# Patient Record
Sex: Male | Born: 1965 | Race: White | Hispanic: No | Marital: Married | Smoking: Former smoker
Health system: Southern US, Community
[De-identification: ages and names within clinical notes are randomized; demographics above are authoritative.]

## PROBLEM LIST (undated history)

## (undated) DIAGNOSIS — K219 Gastro-esophageal reflux disease without esophagitis: Secondary | ICD-10-CM

## (undated) DIAGNOSIS — I10 Essential (primary) hypertension: Secondary | ICD-10-CM

## (undated) DIAGNOSIS — F419 Anxiety disorder, unspecified: Secondary | ICD-10-CM

## (undated) HISTORY — DX: Essential (primary) hypertension: I10

## (undated) HISTORY — PX: TUMOR EXCISION: SHX421

## (undated) HISTORY — DX: Anxiety disorder, unspecified: F41.9

## (undated) HISTORY — PX: OTHER SURGICAL HISTORY: SHX169

## (undated) HISTORY — DX: Gastro-esophageal reflux disease without esophagitis: K21.9

---

## 1999-08-11 ENCOUNTER — Encounter: Payer: Self-pay | Admitting: Internal Medicine

## 1999-08-11 ENCOUNTER — Ambulatory Visit (HOSPITAL_COMMUNITY): Admission: RE | Admit: 1999-08-11 | Discharge: 1999-08-11 | Payer: Self-pay | Admitting: Internal Medicine

## 2000-09-21 ENCOUNTER — Ambulatory Visit (HOSPITAL_COMMUNITY): Admission: RE | Admit: 2000-09-21 | Discharge: 2000-09-21 | Payer: Self-pay | Admitting: Internal Medicine

## 2000-09-21 ENCOUNTER — Encounter: Payer: Self-pay | Admitting: Internal Medicine

## 2001-09-10 ENCOUNTER — Ambulatory Visit (HOSPITAL_COMMUNITY): Admission: RE | Admit: 2001-09-10 | Discharge: 2001-09-10 | Payer: Self-pay | Admitting: Internal Medicine

## 2001-09-10 ENCOUNTER — Encounter: Payer: Self-pay | Admitting: Internal Medicine

## 2001-11-09 ENCOUNTER — Encounter: Payer: Self-pay | Admitting: Orthopedic Surgery

## 2001-11-09 ENCOUNTER — Encounter: Admission: RE | Admit: 2001-11-09 | Discharge: 2001-11-09 | Payer: Self-pay | Admitting: Orthopedic Surgery

## 2001-11-30 ENCOUNTER — Encounter: Admission: RE | Admit: 2001-11-30 | Discharge: 2001-11-30 | Payer: Self-pay | Admitting: Orthopedic Surgery

## 2001-11-30 ENCOUNTER — Encounter: Payer: Self-pay | Admitting: Orthopedic Surgery

## 2003-05-01 ENCOUNTER — Encounter: Payer: Self-pay | Admitting: Internal Medicine

## 2003-05-01 ENCOUNTER — Encounter: Admission: RE | Admit: 2003-05-01 | Discharge: 2003-05-01 | Payer: Self-pay | Admitting: Internal Medicine

## 2005-07-26 ENCOUNTER — Ambulatory Visit (HOSPITAL_COMMUNITY): Admission: RE | Admit: 2005-07-26 | Discharge: 2005-07-26 | Payer: Self-pay | Admitting: Orthopedic Surgery

## 2005-07-26 ENCOUNTER — Ambulatory Visit (HOSPITAL_BASED_OUTPATIENT_CLINIC_OR_DEPARTMENT_OTHER): Admission: RE | Admit: 2005-07-26 | Discharge: 2005-07-26 | Payer: Self-pay | Admitting: Orthopedic Surgery

## 2016-06-03 ENCOUNTER — Other Ambulatory Visit: Payer: Self-pay | Admitting: Orthopedic Surgery

## 2016-06-03 DIAGNOSIS — R531 Weakness: Secondary | ICD-10-CM

## 2016-06-03 DIAGNOSIS — M5412 Radiculopathy, cervical region: Secondary | ICD-10-CM

## 2016-06-03 DIAGNOSIS — M79603 Pain in arm, unspecified: Secondary | ICD-10-CM

## 2016-06-17 ENCOUNTER — Ambulatory Visit
Admission: RE | Admit: 2016-06-17 | Discharge: 2016-06-17 | Disposition: A | Discharge status: Home / Self Care | Payer: Self-pay | Source: Ambulatory Visit | Attending: Orthopedic Surgery | Admitting: Orthopedic Surgery

## 2016-06-17 DIAGNOSIS — M5412 Radiculopathy, cervical region: Secondary | ICD-10-CM

## 2016-06-17 DIAGNOSIS — R531 Weakness: Secondary | ICD-10-CM

## 2016-06-17 DIAGNOSIS — M79603 Pain in arm, unspecified: Secondary | ICD-10-CM

## 2017-11-08 NOTE — Patient Instructions (Signed)

## 2017-11-08 NOTE — Progress Notes (Signed)
[facility]ULT [facility]   Unk Pinto, M.D.     Uvaldo Bristle. Silverio Lay, P.A.-C Liane Comber, [place]                [place] [address] Telephone [phone] Telefax [phone] Annual  Screening/Preventative Visit  & Comprehensive Evaluation & Examination     This very nice 52 y.o.  MWM last seen about 6-7 years ago returns to re-establish &  presents for a Screening/Preventative Visit & comprehensive evaluation and management of multiple medical co-morbidities.  Patient has hx/o labile HTN, HLD and is screened for Prediabetes and Vitamin D Deficiency.      Patient's BP has allegedly been controlled at home. Patient has Morbid Obesity (BMI 33+) and alleges a 30# weight loss over the last 3 months. Today's BP is sl elevated at 132/94. Patient denies any cardiac symptoms as chest pain, palpitations, shortness of breath, dizziness or ankle swelling.        Finally, patient has history of Vitamin D Deficiency.  Allergies  Allergen Reactions  . Penicillins    Health Maintenance  Topic Date Due  . HIV Screening  12/23/1979  . TETANUS/TDAP  12/23/1983  . COLONOSCOPY  12/23/2014  . INFLUENZA VACCINE  03/08/2018   Immunization History  Administered Date(s) Administered  . Tdap 11/09/2017   Last Colon - declines   Social History   Socioeconomic History  . Marital status: Married    Spouse name: Beverlee Nims  . Number of children: 1 son 62 yo & 1 daughter 27 yo.   Occupational History  . Manages a [employer].   Tobacco Use  . Smoking status: Former Smoker    Last attempt to quit: 10/17/2016    Years since quitting: 1.0  . Smokeless tobacco: Never Used  Substance and Sexual Activity  . Alcohol use: Never    Frequency: Never  . Drug use: Not on file  . Sexual activity: Active    ROS Constitutional: Denies fever, chills, weight loss/gain, headaches, insomnia,   night sweats or change in appetite. Does c/o fatigue. Eyes: Denies redness, blurred vision, diplopia, discharge, itchy or watery eyes.  ENT: Denies discharge, congestion, post nasal drip, epistaxis, sore throat, earache, hearing loss, dental pain, Tinnitus, Vertigo, Sinus pain or snoring.  Cardio: Denies chest pain, palpitations, irregular heartbeat, syncope, dyspnea, diaphoresis, orthopnea, PND, claudication or edema Respiratory: denies cough, dyspnea, DOE, pleurisy, hoarseness, laryngitis or wheezing.  Gastrointestinal: Denies dysphagia, heartburn, reflux, water brash, pain, cramps, nausea, vomiting, bloating, diarrhea, constipation, hematemesis, melena, hematochezia, jaundice or hemorrhoids Genitourinary: Denies dysuria, frequency, urgency, nocturia, hesitancy, discharge, hematuria or flank pain Musculoskeletal: Denies arthralgia, myalgia, stiffness, Jt. Swelling, pain, limp or strain/sprain. Denies Falls. Skin: Denies puritis, rash, hives, warts, acne, eczema or change in skin lesion Neuro: No weakness, tremor, incoordination, spasms, paresthesia or pain Psychiatric: Denies confusion, memory loss or sensory loss. Denies Depression. Endocrine: Denies change in weight, skin, hair change, nocturia, and paresthesia, diabetic polys, visual blurring or hyper / hypo glycemic episodes.  Heme/Lymph: No excessive bleeding, bruising or enlarged lymph nodes.  Physical Exam  BP (!) 132/94   Pulse 76   Temp 97.9 F (36.6 C)   Resp 16   Ht 5\' 8"  (1.727 m)   Wt 221 lb 9.6 oz (100.5 kg)   BMI 33.69 kg/m   General Appearance: Well nourished and well  groomed and in no apparent distress.  Eyes: PERRLA, EOMs, conjunctiva no swelling or erythema, normal fundi and vessels. Sinuses: No frontal/maxillary tenderness ENT/Mouth: EACs patent / TMs  nl. Nares clear without erythema, swelling, mucoid exudates. Oral hygiene is good. No erythema, swelling, or exudate. Tongue normal, non-obstructing. Tonsils not  swollen or erythematous. Hearing normal.  Neck: Supple, thyroid not palpable. No bruits, nodes or JVD. Respiratory: Respiratory effort normal.  BS equal and clear bilateral without rales, rhonci, wheezing or stridor. Cardio: Heart sounds are normal with regular rate and rhythm and no murmurs, rubs or gallops. Peripheral pulses are normal and equal bilaterally without edema. No aortic or femoral bruits. Chest: symmetric with normal excursions and percussion.  Abdomen: Soft, with Nl bowel sounds. Nontender, no guarding, rebound, hernias, masses, or organomegaly.  Lymphatics: Non tender without lymphadenopathy.  Genitourinary: No hernias.Testes nl. DRE - prostate nl for age - smooth & firm w/o nodules. Musculoskeletal: Full ROM all peripheral extremities, joint stability, 5/5 strength, and normal gait. Skin: Warm and dry without rashes, lesions, cyanosis, clubbing or  ecchymosis.  Neuro: Cranial nerves intact, reflexes equal bilaterally. Normal muscle tone, no cerebellar symptoms. Sensation intact.  Pysch: Alert and oriented X 3 with normal affect, insight and judgment appropriate.   Assessment and Plan  1. Annual Preventative/Screening Exam   2. Labile hypertension  - encouraged home BP monitoring - EKG 12-Lead - [place], RETROPERITNL ABD,  LTD - CBC with Differential/Platelet  3. Hyperlipidemia, mixed  - EKG 12-Lead - [place], RETROPERITNL ABD,  LTD - Lipid panel  4. Abnormal glucose  - EKG 12-Lead - [place], RETROPERITNL ABD,  LTD  5. Vitamin D deficiency   6. Encounter for colorectal cancer screening  - POC Hemoccult Bld/Stl (3-Cd Home Screen); Future  7. Prostate cancer screening  - PSA  8. Screening for ischemic heart disease  - EKG 12-Lead  9. FHx: heart disease  - EKG 12-Lead - [place], RETROPERITNL ABD,  LTD  10. Screening for AAA (aortic abdominal aneurysm)  - [place], RETROPERITNL ABD,  LTD  11. Need for prophylactic vaccination with combined diphtheria-tetanus-pertussis  (DTP) vaccine  - Tdap vaccine greater than or equal to 7yo IM  12. Fatigue  - CBC with Differential/Platelet  13. Medication management  - CBC with Differential/Platelet - Lipid panel          Patient was counseled in prudent diet, weight control to achieve/maintain BMI less than 25, BP monitoring, regular exercise and medications as discussed. Very limited lab screening at patient request due to high deductible Over 40 minutes of exam, counseling, chart review and high complex critical decision making was performed

## 2017-11-09 ENCOUNTER — Encounter: Payer: Self-pay | Admitting: Internal Medicine

## 2017-11-09 ENCOUNTER — Ambulatory Visit: Payer: BC Managed Care – PPO | Admitting: Internal Medicine

## 2017-11-09 VITALS — BP 132/94 | HR 76 | Temp 97.9°F | Resp 16 | Ht 68.0 in | Wt 221.6 lb

## 2017-11-09 DIAGNOSIS — I10 Essential (primary) hypertension: Secondary | ICD-10-CM

## 2017-11-09 DIAGNOSIS — R5383 Other fatigue: Secondary | ICD-10-CM

## 2017-11-09 DIAGNOSIS — Z1212 Encounter for screening for malignant neoplasm of rectum: Secondary | ICD-10-CM

## 2017-11-09 DIAGNOSIS — Z79899 Other long term (current) drug therapy: Secondary | ICD-10-CM

## 2017-11-09 DIAGNOSIS — R7309 Other abnormal glucose: Secondary | ICD-10-CM

## 2017-11-09 DIAGNOSIS — Z0001 Encounter for general adult medical examination with abnormal findings: Secondary | ICD-10-CM

## 2017-11-09 DIAGNOSIS — Z0000 Encounter for general adult medical examination without abnormal findings: Secondary | ICD-10-CM | POA: Diagnosis not present

## 2017-11-09 DIAGNOSIS — Z13220 Encounter for screening for lipoid disorders: Secondary | ICD-10-CM | POA: Diagnosis not present

## 2017-11-09 DIAGNOSIS — Z136 Encounter for screening for cardiovascular disorders: Secondary | ICD-10-CM | POA: Diagnosis not present

## 2017-11-09 DIAGNOSIS — Z23 Encounter for immunization: Secondary | ICD-10-CM

## 2017-11-09 DIAGNOSIS — E559 Vitamin D deficiency, unspecified: Secondary | ICD-10-CM

## 2017-11-09 DIAGNOSIS — R0989 Other specified symptoms and signs involving the circulatory and respiratory systems: Secondary | ICD-10-CM

## 2017-11-09 DIAGNOSIS — Z8249 Family history of ischemic heart disease and other diseases of the circulatory system: Secondary | ICD-10-CM

## 2017-11-09 DIAGNOSIS — Z125 Encounter for screening for malignant neoplasm of prostate: Secondary | ICD-10-CM | POA: Diagnosis not present

## 2017-11-09 DIAGNOSIS — Z1211 Encounter for screening for malignant neoplasm of colon: Secondary | ICD-10-CM

## 2017-11-09 DIAGNOSIS — E782 Mixed hyperlipidemia: Secondary | ICD-10-CM

## 2017-11-09 LAB — LIPID PANEL
CHOLESTEROL: 146 mg/dL (ref <200)
HDL: 35 mg/dL — ABNORMAL LOW (ref >40)
LDL Cholesterol (Calc): 92 mg/dL (calc)
Non-HDL Cholesterol (Calc): 111 mg/dL (calc) (ref <130)
Total CHOL/HDL Ratio: 4.2 (calc) (ref <5.0)
Triglycerides: 95 mg/dL (ref <150)

## 2017-11-09 LAB — CBC WITH DIFFERENTIAL/PLATELET
BASOS PCT: 1.1 %
Basophils Absolute: 53 cells/uL (ref 0–200)
Eosinophils Absolute: 72 cells/uL (ref 15–500)
Eosinophils Relative: 1.5 %
HCT: 43.4 % (ref 38.5–50.0)
Hemoglobin: 14.8 g/dL (ref 13.2–17.1)
Lymphs Abs: 1382 cells/uL (ref 850–3900)
MCH: 30.2 pg (ref 27.0–33.0)
MCHC: 34.1 g/dL (ref 32.0–36.0)
MCV: 88.6 fL (ref 80.0–100.0)
MONOS PCT: 8.4 %
MPV: 10.1 fL (ref 7.5–12.5)
NEUTROS PCT: 60.2 %
Neutro Abs: 2890 cells/uL (ref 1500–7800)
PLATELETS: 202 10*3/uL (ref 140–400)
RBC: 4.9 10*6/uL (ref 4.20–5.80)
RDW: 12.6 % (ref 11.0–15.0)
TOTAL LYMPHOCYTE: 28.8 %
WBC: 4.8 10*3/uL (ref 3.8–10.8)
WBCMIX: 403 {cells}/uL (ref 200–950)

## 2017-11-09 LAB — PSA: PSA: 0.4 ng/mL (ref <=4.0)

## 2017-11-09 MED ORDER — ATENOLOL 50 MG PO TABS: ORAL_TABLET | ORAL | 1 refills | Status: DC

## 2017-12-19 ENCOUNTER — Ambulatory Visit: Payer: BC Managed Care – PPO | Admitting: Internal Medicine

## 2017-12-19 ENCOUNTER — Encounter: Payer: Self-pay | Admitting: Internal Medicine

## 2017-12-19 VITALS — BP 120/76 | HR 60 | Temp 97.1°F | Resp 16 | Ht 68.0 in | Wt 218.4 lb

## 2017-12-19 DIAGNOSIS — Z8249 Family history of ischemic heart disease and other diseases of the circulatory system: Secondary | ICD-10-CM | POA: Diagnosis not present

## 2017-12-19 DIAGNOSIS — E559 Vitamin D deficiency, unspecified: Secondary | ICD-10-CM

## 2017-12-19 DIAGNOSIS — K219 Gastro-esophageal reflux disease without esophagitis: Secondary | ICD-10-CM | POA: Diagnosis not present

## 2017-12-19 DIAGNOSIS — I10 Essential (primary) hypertension: Secondary | ICD-10-CM

## 2017-12-19 DIAGNOSIS — Z87891 Personal history of nicotine dependence: Secondary | ICD-10-CM | POA: Diagnosis not present

## 2017-12-19 NOTE — Patient Instructions (Signed)

## 2017-12-19 NOTE — Progress Notes (Signed)
  Subjective:    Patient ID: Derek Diaz, male    DOB: 1965/12/03, 52 y.o.   MRN: [id number]  HPI  This nice 52 yo MWM returns for 1 month f/u after starting Atenolol 50 mg for HTN and had an initial hypotensive rxn and was advised to decrease dose to 1/2 tab daily. Since then he reports BP's have been normal and he has felt much better in general.  He is down another 3-4# since last OV - now a total loss of 34# since Jan 1st ! which he attributes to better food choices. Hypertensive systems review is negative.    Medication Sig  . atenolol (TENORMIN) 50 MG tablet Take 1 tablet every morning for BP   Review of Systems  10 point systems review negative except as above.    Objective:   Physical Exam  BP 120/76   Pulse 60   Temp (!) 97.1 F (36.2 C)   Resp 16   Ht 5\' 8"  (1.727 m)   Wt 218 lb 6.4 oz (99.1 kg)   BMI 33.21 kg/m   HEENT - WNL. Neck - supple.  Chest - Clear equal BS. Cor - Nl HS. RRR w/o sig MGR. PP 1(+). No edema. MS- FROM w/o deformities.  Gait Nl. Neuro -  Nl w/o focal abnormalities.    Assessment & Plan:   1. Labile hypertension  - continue med sane and ROV 5 months . Also advised to take Vit D 10,000 units daily and also a LD bASA  81 mg for Cancer prophylaxis.

## 2018-01-12 IMAGING — MR MR CERVICAL SPINE W/O CM
4 of 5 series · 27 of 48 positions shown · non-contrast
Comparison: None.

CLINICAL DATA: Initial evaluation for two-month history of neck
pain radiating into left shoulder with left arm numbness.

EXAM:
MRI CERVICAL SPINE WITHOUT CONTRAST
TECHNIQUE: Multiplanar, multisequence MR imaging of the cervical spine was
performed. No intravenous contrast was administered.

[Series 3: tir sag · sagittal · 3.0mm · 0.41mm/px · 6 of 13 slices shown]
[im 1/13]
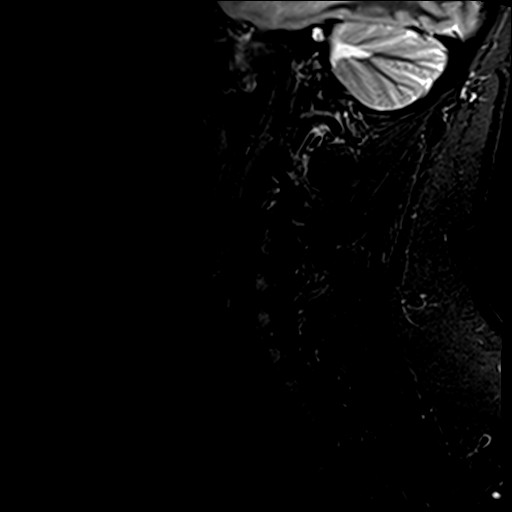
[im 3/13]
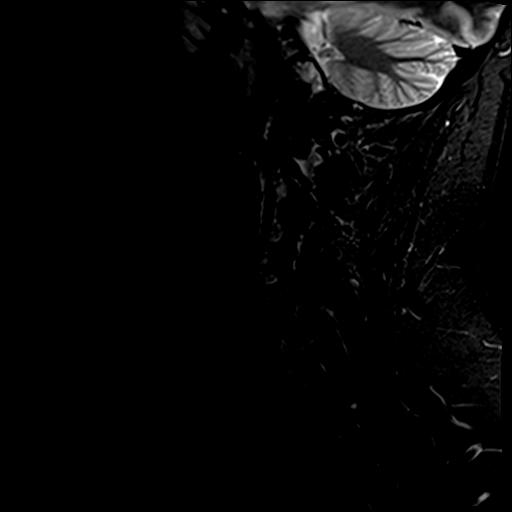
[im 5/13]
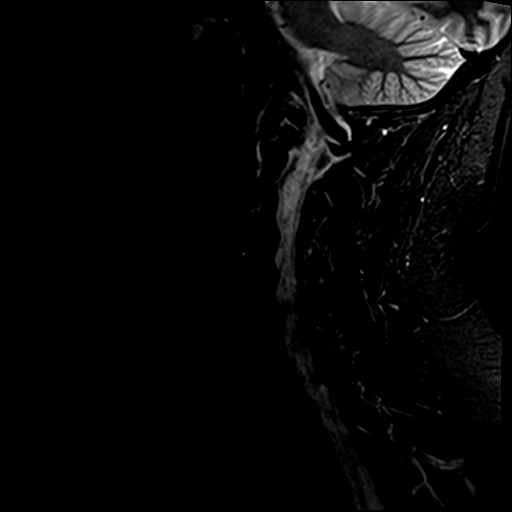
[im 8/13]
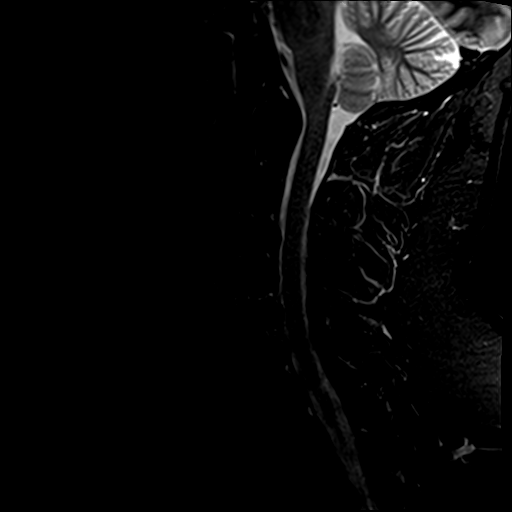
[im 10/13]
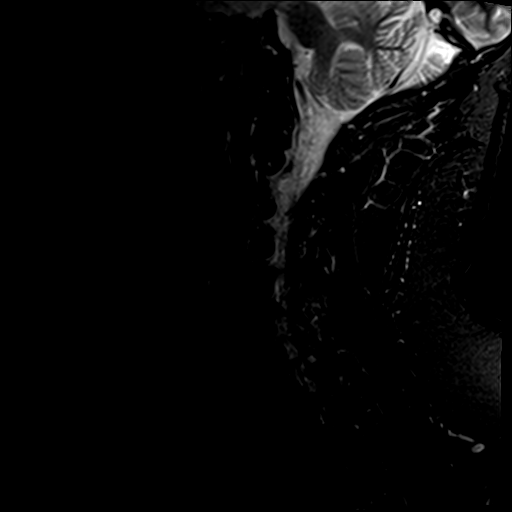
[im 13/13]
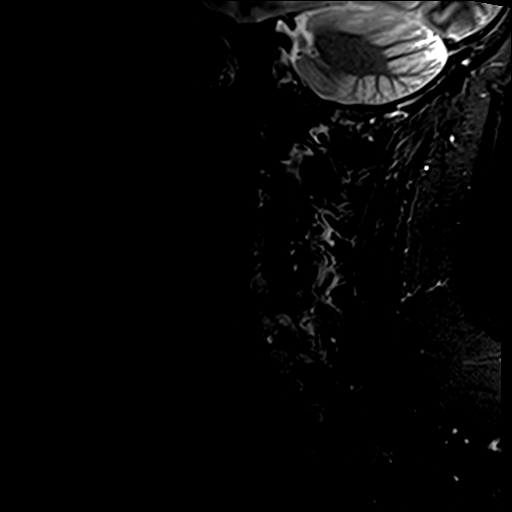

[Series 4: T1 · sagittal · 3.0mm · 0.41mm/px · 6 of 13 slices shown]
[im 1/13]
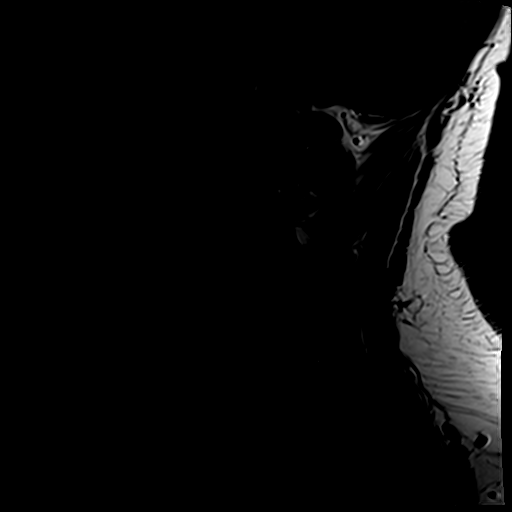
[im 3/13]
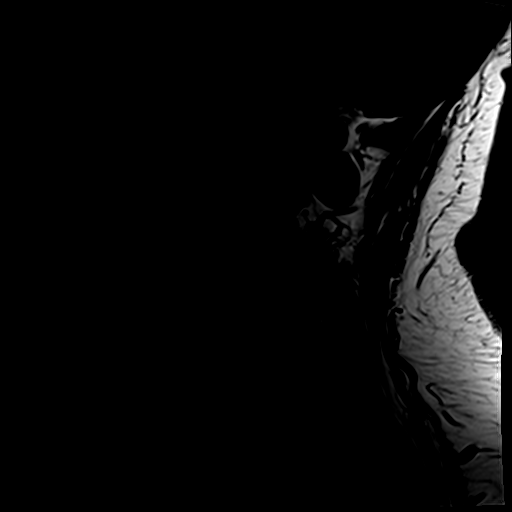
[im 5/13]
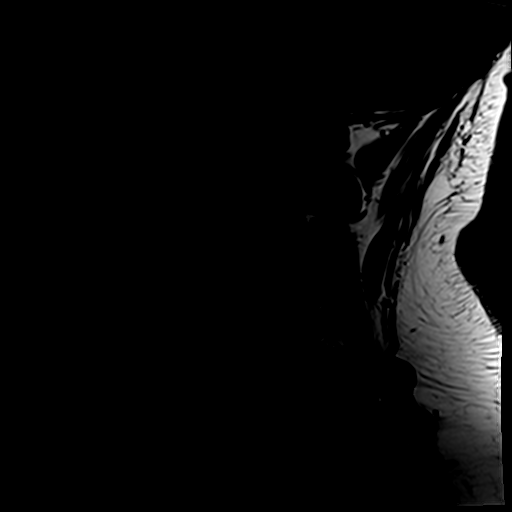
[im 8/13]
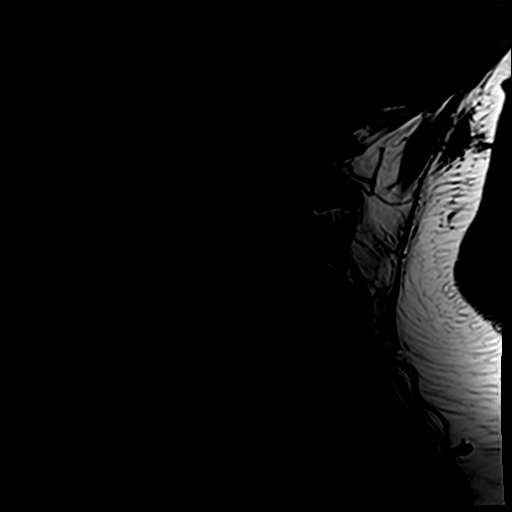
[im 10/13]
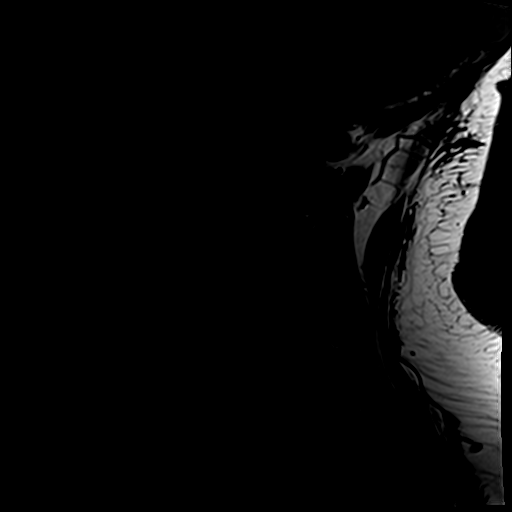
[im 13/13]
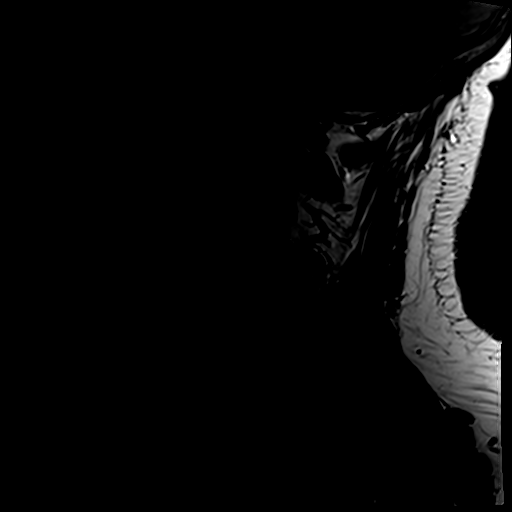

[Series 5: T2 · sagittal · 3.0mm · 0.66mm/px · 6 of 13 slices shown (1 of 2)]
[im 1/13]
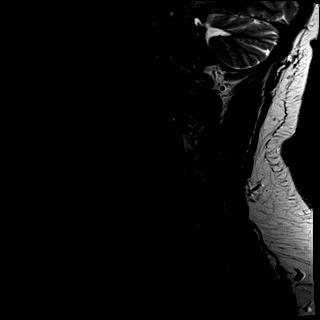
[im 3/13]
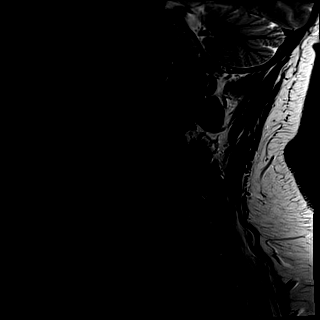
[im 5/13]
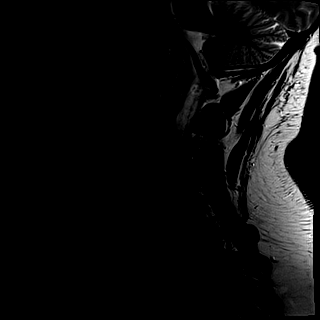
[im 8/13]
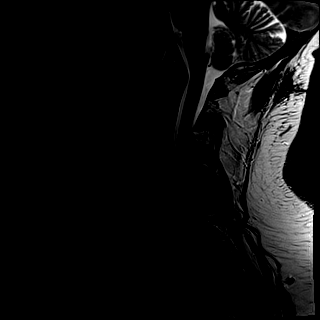
[im 10/13]
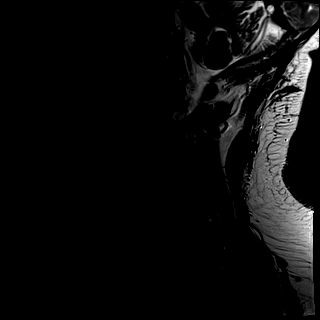
[im 13/13]
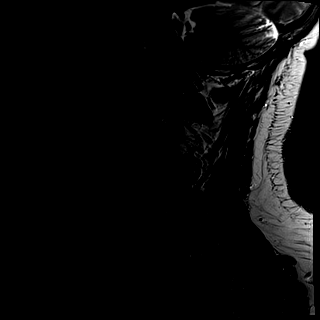

[Series 6: T2 · axial · 3.0mm · 0.70mm/px · z∈[-49,+68]mm · 9 of 32 slices shown (2 of 2)]
[im 1/32]
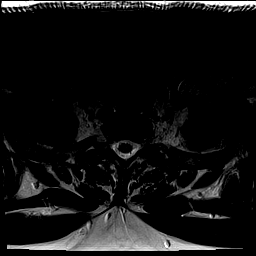
[im 5/32]
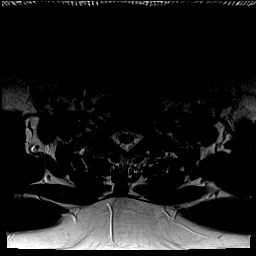
[im 9/32]
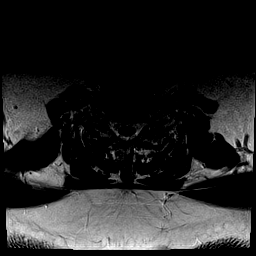
[im 14/32]
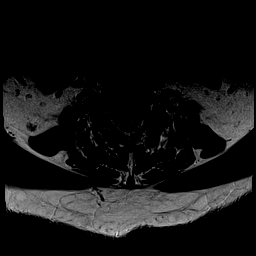
[im 16/32]
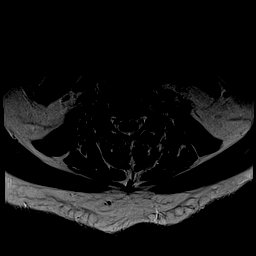
[im 18/32]
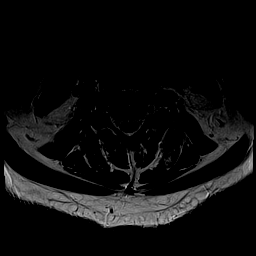
[im 23/32]
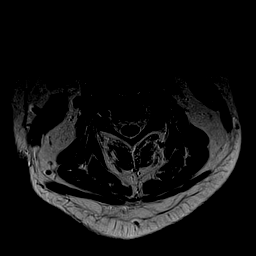
[im 27/32]
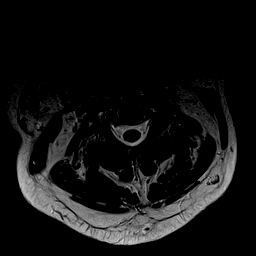
[im 32/32]
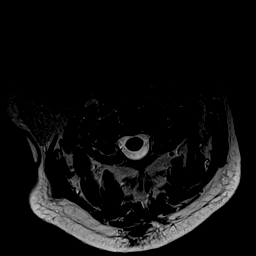

[27 of 48 positions shown; findings below may reference images not displayed]

FINDINGS: Alignment: Vertebral bodies normally aligned with preservation of
the normal cervical lordosis. No listhesis.

Vertebrae: Vertebral body heights are well maintained. No focal
osseous lesions. There is abnormal soft tissue and marrow edema
centered about the left C6-7 facet (series 3, image 13), likely
related to facet degeneration. No discrete fracture or and or
malalignment.

Signal intensity within the vertebral body bone marrow otherwise
normal.

Cord: Signal intensity within the cervical spinal cord is normal.

Posterior Fossa, vertebral arteries, paraspinal tissues: Visualized
portions of the brain and posterior fossa are normal. Craniocervical
junction within normal limits. Other than the inflammatory changes
about the left C6-7 facet, no other soft tissue abnormality
identified. No prevertebral edema. Normal intravascular flow voids
present within the vertebral arteries bilaterally.

Disc levels:

C2-C3: Negative.

C3-C4: Mild diffuse disc bulge with bilateral uncovertebral
hypertrophy. No significant stenosis.

C4-C5: Mild diffuse disc bulge with bilateral uncovertebral
hypertrophy. Mild bilateral foraminal stenosis. No significant canal
narrowing.

C5-C6: Mild diffuse disc bulge with bilateral uncovertebral
spurring. Mild bilateral foraminal stenosis. No significant canal
narrowing posterior disc bulge mildly flattens the ventral thecal
sac without significant canal stenosis.

C6-C7: Mild diffuse disc bulge with bilateral uncovertebral
hypertrophy. No significant canal or foraminal stenosis. Abnormal
edema about the left C6-7 facet (series 3, image 13). This is felt
to most likely be related to facet arthritis. No loculated
collection or other concerning features. Inflammatory changes extend
towards the left C6-7 neural foramen, likely affecting the left C7
nerve root (series 7, image 24). Inflammatory changes also extend
superiorly towards the left C5-6 neural foramina.

C7-T1:  Negative.

Visualized upper thoracic spine within normal limits.
IMPRESSION: 1. Abnormal edema and inflammatory changes about the left C6-7
facet, likely related to underlying facet arthropathy, which may be
either degenerative or inflammatory in nature. Please note that an
infectious process could conceivably have this appearance as well,
and could be considered in the correct clinical setting. Close
interval follow-up with possible repeat imaging (with and without
contrast) is recommended if there is high clinical suspicion for
possible infection.
2. Additional mild multilevel degenerative spondylolysis as above
without significant stenosis.

## 2018-05-09 NOTE — Progress Notes (Signed)
This very nice 52 y.o. MWM presents for 3 month follow up with HTN, HLD, Pre-Diabetes and Vitamin D Deficiency.      Patient is treated for HTN & BP has been controlled at home. Today's BP is at goal - 130/78.  Patient has had no complaints of any cardiac type chest pain, palpitations, dyspnea / orthopnea / PND, dizziness, claudication, or dependent edema.     Hyperlipidemia is controlled with diet & meds. Patient denies myalgias or other med SE's. Last Lipids were  Lab Results  Component Value Date   CHOL 146 11/09/2017   HDL 35 (L) 11/09/2017   LDLCALC 92 11/09/2017   TRIG 95 11/09/2017   CHOLHDL 4.2 11/09/2017      Also, the patient has history of PreDiabetes and has had no symptoms of reactive hypoglycemia, diabetic polys, paresthesias or visual blurring.   Patient's weight is down    43# from 250# in Jan to current 207#      .      Further, the patient also has known history of Vitamin D Deficiency.  Current Outpatient Medications on File Prior to Visit  Medication Sig  . atenolol (TENORMIN) 50 MG tablet Take 1 tablet every morning for BP   No current facility-administered medications on file prior to visit.    Allergies  Allergen Reactions  . Penicillins    PMHx:  No past medical history on file.   Immunization History  Administered Date(s) Administered  . Tdap 11/09/2017   No past surgical history on file.   FHx:    Reviewed / unchanged  SHx:    Reviewed / unchanged   Systems Review:  Constitutional: Denies fever, chills, wt changes, headaches, insomnia, fatigue, night sweats, change in appetite. Eyes: Denies redness, blurred vision, diplopia, discharge, itchy, watery eyes.  ENT: Denies discharge, congestion, post nasal drip, epistaxis, sore throat, earache, hearing loss, dental pain, tinnitus, vertigo, sinus pain, snoring.  CV: Denies chest pain, palpitations, irregular heartbeat, syncope, dyspnea, diaphoresis, orthopnea, PND, claudication or  edema. Respiratory: denies cough, dyspnea, DOE, pleurisy, hoarseness, laryngitis, wheezing.  Gastrointestinal: Denies dysphagia, odynophagia, heartburn, reflux, water brash, abdominal pain or cramps, nausea, vomiting, bloating, diarrhea, constipation, hematemesis, melena, hematochezia  or hemorrhoids. Genitourinary: Denies dysuria, frequency, urgency, nocturia, hesitancy, discharge, hematuria or flank pain. Musculoskeletal: Denies arthralgias, myalgias, stiffness, jt. swelling, pain, limping or strain/sprain.  Skin: Denies pruritus, rash, hives, warts, acne, eczema or change in skin lesion(s). Neuro: No weakness, tremor, incoordination, spasms, paresthesia or pain. Psychiatric: Denies confusion, memory loss or sensory loss. Endo: Denies change in weight, skin or hair change.  Heme/Lymph: No excessive bleeding, bruising or enlarged lymph nodes.  Physical Exam  BP 130/78   Pulse (!) 52   Temp (!) 97 F (36.1 C)   Resp 16   Ht 5\' 8"  (1.727 m)   Wt 207 lb 3.2 oz (94 kg)   BMI 31.50 kg/m   Appears  well nourished, well groomed  and in no distress.  Eyes: PERRLA, EOMs, conjunctiva no swelling or erythema. Sinuses: No frontal/maxillary tenderness ENT/Mouth: EAC's clear, TM's nl w/o erythema, bulging. Nares clear w/o erythema, swelling, exudates. Oropharynx clear without erythema or exudates. Oral hygiene is good. Tongue normal, non obstructing. Hearing intact.  Neck: Supple. Thyroid not palpable. Car 2+/2+ without bruits, nodes or JVD. Chest: Respirations nl with BS clear & equal w/o rales, rhonchi, wheezing or stridor.  Cor: Heart sounds normal w/ regular rate and rhythm without sig. murmurs, gallops, clicks  or rubs. Peripheral pulses normal and equal  without edema.  Abdomen: Soft & bowel sounds normal. Non-tender w/o guarding, rebound, hernias, masses or organomegaly.  Lymphatics: Unremarkable.  Musculoskeletal: Full ROM all peripheral extremities, joint stability, 5/5 strength and  normal gait.  Skin: Warm, dry without exposed rashes, lesions or ecchymosis apparent.  Neuro: Cranial nerves intact, reflexes equal bilaterally. Sensory-motor testing grossly intact. Tendon reflexes grossly intact.  Pysch: Alert & oriented x 3.  Insight and judgement nl & appropriate. No ideations.  Assessment and Plan:  - Continue medication, monitor blood pressure at home.  - Continue DASH diet.  Reminder to go to the ER if any CP,  SOB, nausea, dizziness, severe HA, changes vision/speech.  - Continue diet/meds, exercise,& lifestyle modifications.  - Continue monitor periodic cholesterol/liver & renal functions   1. Essential hypertension  - TSH  2. Hyperlipidemia, mixed   3. Abnormal glucose  - Continue diet, exercise, lifestyle modifications.  - Monitor appropriate labs.  - Hemoglobin A1c  4. Vitamin D deficiency  - Continue supplementation.   5. Medication management  - TSH - Hemoglobin A1c      Discussed  regular exercise, BP monitoring, weight control to achieve/maintain BMI less than 25 and discussed med and SE's. Recommended labs to assess and monitor clinical status with further disposition pending results of labs. Over 30 minutes of exam, counseling, chart review was performed.

## 2018-05-09 NOTE — Patient Instructions (Signed)

## 2018-05-10 ENCOUNTER — Ambulatory Visit: Payer: BC Managed Care – PPO | Admitting: Internal Medicine

## 2018-05-10 VITALS — BP 130/78 | HR 52 | Temp 97.0°F | Resp 16 | Ht 68.0 in | Wt 207.2 lb

## 2018-05-10 DIAGNOSIS — E782 Mixed hyperlipidemia: Secondary | ICD-10-CM | POA: Diagnosis not present

## 2018-05-10 DIAGNOSIS — E559 Vitamin D deficiency, unspecified: Secondary | ICD-10-CM | POA: Diagnosis not present

## 2018-05-10 DIAGNOSIS — I10 Essential (primary) hypertension: Secondary | ICD-10-CM | POA: Diagnosis not present

## 2018-05-10 DIAGNOSIS — R7309 Other abnormal glucose: Secondary | ICD-10-CM | POA: Diagnosis not present

## 2018-05-10 DIAGNOSIS — Z79899 Other long term (current) drug therapy: Secondary | ICD-10-CM

## 2018-05-11 LAB — TSH: TSH: 0.88 mIU/L (ref 0.40–4.50)

## 2018-05-11 LAB — HEMOGLOBIN A1C
HEMOGLOBIN A1C: 4.1 %{Hb} (ref <5.7)
MEAN PLASMA GLUCOSE: 71 (calc)
eAG (mmol/L): 3.9 (calc)

## 2018-05-13 ENCOUNTER — Encounter: Payer: Self-pay | Admitting: Internal Medicine

## 2018-07-10 ENCOUNTER — Other Ambulatory Visit: Payer: Self-pay | Admitting: Internal Medicine

## 2018-12-02 NOTE — Progress Notes (Signed)
[facility]   Unk Pinto, M.D.     Uvaldo Bristle. Silverio Lay, P.A.-C Liane Comber, [place]                [address] [place], [place] [address] Telephone [phone] Telefax [phone] Annual  Screening/Preventative Visit  & Comprehensive Evaluation & Examination  History of Present Illness:     This very nice 53 y.o. MWM presents for a Screening /Preventative Visit & comprehensive evaluation and management of multiple medical co-morbidities.  Patient has been followed for HTN, HLD, Prediabetes and Vitamin D Deficiency.     HTN predates since  April 2019.  Patient's BP has been controlled at home.  Today's BP is at goal - 132/84. Patient denies any cardiac symptoms as chest pain, palpitations, shortness of breath, dizziness or ankle swelling. Patient is walking/jogging  3-4 miles at least 5 days/week.     Patient's hyperlipidemia is controlled with diet. Last lipids were at goal: Lab Results  Component Value Date   CHOL 146 11/09/2017   HDL 35 (L) 11/09/2017   LDLCALC 92 11/09/2017   TRIG 95 11/09/2017   CHOLHDL 4.2 11/09/2017      Patient has hx/o abnormal glucose  and patient denies reactive hypoglycemic symptoms, visual blurring, diabetic polys or paresthesias.  Last year , he lost 19 #down from 250# in Jan to 207 # in April 2019. Last A1c was Normal: Lab Results  Component Value Date   HGBA1C 4.1 05/10/2018       Finally, patient has history of Vitamin D Deficiency of    and last vitamin D was  No results found for: VD25OH  Current Outpatient Medications on File Prior to Visit  Medication Sig  . atenolol (TENORMIN) 50 MG tablet TAKE 1 TABLET EVERY MORNING FOR BP   No current facility-administered medications on file prior to visit.    Allergies  Allergen Reactions  . Penicillins    History reviewed. No pertinent past medical history. Health Maintenance  Topic Date Due  .  HIV Screening  12/23/1979  . COLONOSCOPY  12/23/2014  . INFLUENZA VACCINE  03/09/2019  . TETANUS/TDAP  11/10/2027   Immunization History  Administered Date(s) Administered  . Tdap 11/09/2017   History reviewed. No pertinent surgical history.   Family History  Problem Relation Age of Onset  . Stroke Mother   . Heart disease Mother   . Stroke Father   . Hypertension Father   . Heart disease Father   . Hypertension Brother    Social History   Socioeconomic History  . Marital status: Married    Spouse name: Not on file  . Number of children: Not on file  Occupational History  . Not on file  Tobacco Use  . Smoking status: Former Smoker    Last attempt to quit: 10/17/2016    Years since quitting: 2.1  . Smokeless tobacco: Never Used  Substance and Sexual Activity  . Alcohol use: Never    Frequency: Never  . Drug use: Never  . Sexual activity: Yes    ROS Constitutional: Denies fever, chills, weight loss/gain, headaches, insomnia,  night sweats or change in appetite. Does c/o fatigue. Eyes: Denies redness, blurred vision, diplopia, discharge, itchy or watery eyes.  ENT: Denies discharge, congestion, post nasal drip, epistaxis, sore throat, earache, hearing loss, dental pain, Tinnitus, Vertigo, Sinus pain or snoring.  Cardio: Denies chest pain, palpitations, irregular heartbeat, syncope, dyspnea, diaphoresis, orthopnea, PND, claudication or edema Respiratory: denies cough, dyspnea, DOE, pleurisy, hoarseness, laryngitis or wheezing.  Gastrointestinal: Denies dysphagia, heartburn, reflux, water brash, pain, cramps, nausea, vomiting, bloating, diarrhea, constipation, hematemesis, melena, hematochezia, jaundice or hemorrhoids Genitourinary: Denies dysuria, frequency, urgency, nocturia, hesitancy, discharge, hematuria or flank pain Musculoskeletal: Denies arthralgia, myalgia, stiffness, Jt. Swelling, pain, limp or strain/sprain. Denies Falls. Skin: Denies puritis, rash, hives, warts,  acne, eczema or change in skin lesion Neuro: No weakness, tremor, incoordination, spasms, paresthesia or pain Psychiatric: Denies confusion, memory loss or sensory loss. Denies Depression. Endocrine: Denies change in weight, skin, hair change, nocturia, and paresthesia, diabetic polys, visual blurring or hyper / hypo glycemic episodes.  Heme/Lymph: No excessive bleeding, bruising or enlarged lymph nodes.  Physical Exam  BP 132/84   Pulse (!) 56   Temp (!) 97.3 F (36.3 C)   Resp 16   Ht 5\' 8"  (1.727 m)   Wt 204 lb 12.8 oz (92.9 kg)   BMI 31.14 kg/m   General Appearance: Well nourished and well groomed and in no apparent distress.  Eyes: PERRLA, EOMs, conjunctiva no swelling or erythema, normal fundi and vessels. Sinuses: No frontal/maxillary tenderness ENT/Mouth: EACs patent / TMs  nl. Nares clear without erythema, swelling, mucoid exudates. Oral hygiene is good. No erythema, swelling, or exudate. Tongue normal, non-obstructing. Tonsils not swollen or erythematous. Hearing normal.  Neck: Supple, thyroid not palpable. No bruits, nodes or JVD. Respiratory: Respiratory effort normal.  BS equal and clear bilateral without rales, rhonci, wheezing or stridor. Cardio: Heart sounds are normal with regular rate and rhythm and no murmurs, rubs or gallops. Peripheral pulses are normal and equal bilaterally without edema. No aortic or femoral bruits. Chest: symmetric with normal excursions and percussion.  Abdomen: Soft, with Nl bowel sounds. Nontender, no guarding, rebound, hernias, masses, or organomegaly.  Lymphatics: Non tender without lymphadenopathy.  Musculoskeletal: Full ROM all peripheral extremities, joint stability, 5/5 strength, and normal gait. Skin: Warm and dry without rashes, lesions, cyanosis, clubbing or  ecchymosis.  Neuro: Cranial nerves intact, reflexes equal bilaterally. Normal muscle tone, no cerebellar symptoms. Sensation intact.  Pysch: Alert and oriented X 3 with normal  affect, insight and judgment appropriate.   Assessment and Plan  1. Annual Preventative/Screening Exam   2. Essential hypertension  - EKG 12-Lead - [place], RETROPERITNL ABD,  LTD - Urinalysis, Routine w reflex microscopic - CBC with Differential/Platelet - COMPLETE METABOLIC PANEL WITH GFR - Magnesium - TSH  3. Hyperlipidemia, mixed  - EKG 12-Lead - [place], RETROPERITNL ABD,  LTD - Lipid panel - TSH  4. Abnormal glucose  - EKG 12-Lead - [place], RETROPERITNL ABD,  LTD - Hemoglobin A1c  5. Vitamin D deficiency   6. BPH with obstruction/lower urinary tract symptoms  - PSA  7. Screening for colorectal cancer  - POC Hemoccult Bld/Stl  - Hemoglobin A1c  8. Screening-pulmonary TB  - TB Skin Test  9. Prostate cancer screening  - PSA  10. Screening for ischemic heart disease  - EKG 12-Lead  11. FHx: heart disease  - EKG 12-Lead - [place], RETROPERITNL ABD,  LTD  12. Former smoker  - EKG 12-Lead - [place], RETROPERITNL ABD,  LTD  13. Screening for AAA (aortic abdominal aneurysm)  - [place], RETROPERITNL ABD,  LTD  14. Fatigue  - Iron,Total/Total Iron Binding Cap - Vitamin B12 - Testosterone - CBC with Differential/Platelet - TSH  15. Medication management  - Urinalysis, Routine w reflex  microscopic - POC Hemoccult Bld/Stl (3-Cd Home Screen); Future - CBC with Differential/Platelet - COMPLETE METABOLIC PANEL WITH GFR - Magnesium - Lipid panel - TSH - Hemoglobin A1c - Insulin, random - VITAMIN D 25 Hydroxyl        Patient was counseled in prudent diet, weight control to achieve/maintain BMI less than 25, BP monitoring, regular exercise and medications as discussed.  Discussed med effects and SE's. Routine screening labs and tests as requested with regular follow-up as recommended. I discussed the assessment and treatment plan as above with the patient. The patient was provided an opportunity to ask questions and all were answered. The patient agreed with the plan and  demonstrated an understanding of the instructions.Over 40 minutes of exam, counseling, chart review and high complex critical decision making was performed  Kirtland Bouchard, MD

## 2018-12-02 NOTE — Patient Instructions (Signed)
Coronavirus (COVID-19) Are you at risk?  Are you at risk for the Coronavirus (COVID-19)?  To be considered HIGH RISK for Coronavirus (COVID-19), you have to meet the following criteria:  . Traveled to [place], [place], [place], [place] or [place]; or in the [place] to [place], [place], [place]  . or [place]; and have fever, cough, and shortness of breath within the last 2 weeks of travel OR . Been in close contact with a person diagnosed with COVID-19 within the last 2 weeks and have  . fever, cough,and shortness of breath .  . IF YOU DO NOT MEET THESE CRITERIA, YOU ARE CONSIDERED LOW RISK FOR COVID-19.  What to do if you are HIGH RISK for COVID-19?  . If you are having a medical emergency, call 911. . Seek medical care right away. Before you go to a doctor's office, urgent care or emergency department, .  call ahead and tell them about your recent travel, contact with someone diagnosed with COVID-19  .  and your symptoms.  . You should receive instructions from your physician's office regarding next steps of care.  . When you arrive at healthcare provider, tell the healthcare staff immediately you have returned from  . visiting [place], [place], [place], [place] or [place]; or traveled in the [place] to [place], [place],  . [place] or [place] in the last two weeks or you have been in close contact with a person diagnosed with  . COVID-19 in the last 2 weeks.   . Tell the health care staff about your symptoms: fever, cough and shortness of breath. . After you have been seen by a medical provider, you will be either: o Tested for (COVID-19) and discharged home on quarantine except to seek medical care if  o symptoms worsen, and asked to  - Stay home and avoid contact with others until you get your results (4-5 days)  - Avoid travel on public transportation if possible (such as bus, train, or airplane) or o Sent to the Emergency Department by EMS for evaluation,  COVID-19 testing  and  o possible admission depending on your condition and test results.  What to do if you are LOW RISK for COVID-19?  Reduce your risk of any infection by using the same precautions used for avoiding the common cold or flu:  . Wash your hands often with soap and warm water for at least 20 seconds.  If soap and water are not readily available,  . use an alcohol-based hand sanitizer with at least 60% alcohol.  . If coughing or sneezing, cover your mouth and nose by coughing or sneezing into the elbow areas of your shirt or coat, .  into a tissue or into your sleeve (not your hands). . Avoid shaking hands with others and consider head nods or verbal greetings only. . Avoid touching your eyes, nose, or mouth with unwashed hands.  . Avoid close contact with people who are sick. . Avoid places or events with large numbers of people in one location, like concerts or sporting events. . Carefully consider travel plans you have or are making. . If you are planning any travel outside or inside the [place], visit the CDC's Travelers' Health webpage for the latest health notices. . If you have some symptoms but not all symptoms, continue to monitor at home and seek medical attention  . if your symptoms worsen. . If you are having a medical emergency, call 911. >>>>>>>>>>>>>>>>>>>>>>>>>>>>   Preventive Care for Adults  A healthy lifestyle and preventive care can promote health and wellness. Preventive health guidelines for men include the following key practices:  A routine yearly physical is a good way to check with your health care provider about your health and preventative screening. It is a chance to share any concerns and updates on your health and to receive a thorough exam.  Visit your dentist for a routine exam and preventative care every 6 months. Brush your teeth twice a day and floss once a day. Good oral hygiene prevents tooth decay and gum disease.  The frequency of eye exams  is based on your age, health, family medical history, use of contact lenses, and other factors. Follow your health care provider's recommendations for frequency of eye exams.  Eat a healthy diet. Foods such as vegetables, fruits, whole grains, low-fat dairy products, and lean protein foods contain the nutrients you need without too many calories. Decrease your intake of foods high in solid fats, added sugars, and salt. Eat the right amount of calories for you. Get information about a proper diet from your health care provider, if necessary.  Regular physical exercise is one of the most important things you can do for your health. Most adults should get at least 150 minutes of moderate-intensity exercise (any activity that increases your heart rate and causes you to sweat) each week. In addition, most adults need muscle-strengthening exercises on 2 or more days a week.  Maintain a healthy weight. The body mass index (BMI) is a screening tool to identify possible weight problems. It provides an estimate of body fat based on height and weight. Your health care provider can find your BMI and can help you achieve or maintain a healthy weight. For adults 20 years and older:  A BMI below 18.5 is considered underweight.  A BMI of 18.5 to 24.9 is normal.  A BMI of 25 to 29.9 is considered overweight.  A BMI of 30 and above is considered obese.  Maintain normal blood lipids and cholesterol levels by exercising and minimizing your intake of saturated fat. Eat a balanced diet with plenty of fruit and vegetables. Blood tests for lipids and cholesterol should begin at age 20 and be repeated every 5 years. If your lipid or cholesterol levels are high, you are over 50, or you are at high risk for heart disease, you may need your cholesterol levels checked more frequently. Ongoing high lipid and cholesterol levels should be treated with medicines if diet and exercise are not working.  If you smoke, find out from  your health care provider how to quit. If you do not use tobacco, do not start.  Lung cancer screening is recommended for adults aged 55-80 years who are at high risk for developing lung cancer because of a history of smoking. A yearly low-dose CT scan of the lungs is recommended for people who have at least a 30-pack-year history of smoking and are a current smoker or have quit within the past 15 years. A pack year of smoking is smoking an average of 1 pack of cigarettes a day for 1 year (for example: 1 pack a day for 30 years or 2 packs a day for 15 years). Yearly screening should continue until the smoker has stopped smoking for at least 15 years. Yearly screening should be stopped for people who develop a health problem that would prevent them from having lung cancer treatment.  If you choose to drink alcohol,   do not have more than 2 drinks per day. One drink is considered to be 12 ounces (355 mL) of beer, 5 ounces (148 mL) of wine, or 1.5 ounces (44 mL) of liquor.  Avoid use of street drugs. Do not share needles with anyone. Ask for help if you need support or instructions about stopping the use of drugs.  High blood pressure causes heart disease and increases the risk of stroke. Your blood pressure should be checked at least every 1-2 years. Ongoing high blood pressure should be treated with medicines, if weight loss and exercise are not effective.  If you are 45-79 years old, ask your health care provider if you should take aspirin to prevent heart disease.  Diabetes screening involves taking a blood sample to check your fasting blood sugar level. This should be done once every 3 years, after age 45, if you are within normal weight and without risk factors for diabetes. Testing should be considered at a younger age or be carried out more frequently if you are overweight and have at least 1 risk factor for diabetes.  Colorectal cancer can be detected and often prevented. Most routine colorectal  cancer screening begins at the age of 50 and continues through age 75. However, your health care provider may recommend screening at an earlier age if you have risk factors for colon cancer. On a yearly basis, your health care provider may provide home test kits to check for hidden blood in the stool. Use of a small camera at the end of a tube to directly examine the colon (sigmoidoscopy or colonoscopy) can detect the earliest forms of colorectal cancer. Talk to your health care provider about this at age 50, when routine screening begins. Direct exam of the colon should be repeated every 5-10 years through age 75, unless early forms of precancerous polyps or small growths are found.   Talk with your health care provider about prostate cancer screening.  Testicular cancer screening isrecommended for adult males. Screening includes self-exam, a health care provider exam, and other screening tests. Consult with your health care provider about any symptoms you have or any concerns you have about testicular cancer.  Use sunscreen. Apply sunscreen liberally and repeatedly throughout the day. You should seek shade when your shadow is shorter than you. Protect yourself by wearing long sleeves, pants, a wide-brimmed hat, and sunglasses year round, whenever you are outdoors.  Once a month, do a whole-body skin exam, using a mirror to look at the skin on your back. Tell your health care provider about new moles, moles that have irregular borders, moles that are larger than a pencil eraser, or moles that have changed in shape or color.  Stay current with required vaccines (immunizations).  Influenza vaccine. All adults should be immunized every year.  Tetanus, diphtheria, and acellular pertussis (Td, Tdap) vaccine. An adult who has not previously received Tdap or who does not know his vaccine status should receive 1 dose of Tdap. This initial dose should be followed by tetanus and diphtheria toxoids (Td) booster  doses every 10 years. Adults with an unknown or incomplete history of completing a 3-dose immunization series with Td-containing vaccines should begin or complete a primary immunization series including a Tdap dose. Adults should receive a Td booster every 10 years.  Varicella vaccine. An adult without evidence of immunity to varicella should receive 2 doses or a second dose if he has previously received 1 dose.  Human papillomavirus (HPV) vaccine. Males aged 13-21   years who have not received the vaccine previously should receive the 3-dose series. Males aged 22-26 years may be immunized. Immunization is recommended through the age of 26 years for any male who has sex with males and did not get any or all doses earlier. Immunization is recommended for any person with an immunocompromised condition through the age of 26 years if he did not get any or all doses earlier. During the 3-dose series, the second dose should be obtained 4-8 weeks after the first dose. The third dose should be obtained 24 weeks after the first dose and 16 weeks after the second dose.  Zoster vaccine. One dose is recommended for adults aged 60 years or older unless certain conditions are present.    PREVNAR  - Pneumococcal 13-valent conjugate (PCV13) vaccine. When indicated, a person who is uncertain of his immunization history and has no record of immunization should receive the PCV13 vaccine. An adult aged 19 years or older who has certain medical conditions and has not been previously immunized should receive 1 dose of PCV13 vaccine. This PCV13 should be followed with a dose of pneumococcal polysaccharide (PPSV23) vaccine. The PPSV23 vaccine dose should be obtained at least 1 r more year(s) after the dose of PCV13 vaccine. An adult aged 19 years or older who has certain medical conditions and previously received 1 or more doses of PPSV23 vaccine should receive 1 dose of PCV13. The PCV13 vaccine dose should be obtained 1 or more  years after the last PPSV23 vaccine dose.    PNEUMOVAX - Pneumococcal polysaccharide (PPSV23) vaccine. When PCV13 is also indicated, PCV13 should be obtained first. All adults aged 65 years and older should be immunized. An adult younger than age 65 years who has certain medical conditions should be immunized. Any person who resides in a nursing home or long-term care facility should be immunized. An adult smoker should be immunized. People with an immunocompromised condition and certain other conditions should receive both PCV13 and PPSV23 vaccines. People with human immunodeficiency virus (HIV) infection should be immunized as soon as possible after diagnosis. Immunization during chemotherapy or radiation therapy should be avoided. Routine use of PPSV23 vaccine is not recommended for American Indians, [place] Natives, or people younger than 65 years unless there are medical conditions that require PPSV23 vaccine. When indicated, people who have unknown immunization and have no record of immunization should receive PPSV23 vaccine. One-time revaccination 5 years after the first dose of PPSV23 is recommended for people aged 19-64 years who have chronic kidney failure, nephrotic syndrome, asplenia, or immunocompromised conditions. People who received 1-2 doses of PPSV23 before age 65 years should receive another dose of PPSV23 vaccine at age 65 years or later if at least 5 years have passed since the previous dose. Doses of PPSV23 are not needed for people immunized with PPSV23 at or after age 65 years.    Hepatitis A vaccine. Adults who wish to be protected from this disease, have certain high-risk conditions, work with hepatitis A-infected animals, work in hepatitis A research labs, or travel to or work in countries with a high rate of hepatitis A should be immunized. Adults who were previously unvaccinated and who anticipate close contact with an international adoptee during the first 60 days after arrival  in the [place] from a country with a high rate of hepatitis A should be immunized.    Hepatitis B vaccine. Adults should be immunized if they wish to be protected from this disease, have certain   high-risk conditions, may be exposed to blood or other infectious body fluids, are household contacts or sex partners of hepatitis B positive people, are clients or workers in certain care facilities, or travel to or work in countries with a high rate of hepatitis B.   Preventive Service / Frequency   Ages 40 to 64  Blood pressure check.  Lipid and cholesterol check  Lung cancer screening. / Every year if you are aged 55-80 years and have a 30-pack-year history of smoking and currently smoke or have quit within the past 15 years. Yearly screening is stopped once you have quit smoking for at least 15 years or develop a health problem that would prevent you from having lung cancer treatment.  Fecal occult blood test (FOBT) of stool. / Every year beginning at age 50 and continuing until age 75. You may not have to do this test if you get a colonoscopy every 10 years.  Flexible sigmoidoscopy** or colonoscopy.** / Every 5 years for a flexible sigmoidoscopy or every 10 years for a colonoscopy beginning at age 50 and continuing until age 75. Screening for abdominal aortic aneurysm (AAA)  by ultrasound is recommended for people who have history of high blood pressure or who are current or former smokers. +++++++++++ Recommend Adult Low Dose Aspirin or  coated  Aspirin 81 mg daily  To reduce risk of Colon Cancer 20 %,  Skin Cancer 26 % ,  Malignant Melanoma 46%  and  Pancreatic cancer 60% ++++++++++++++++++++ Vitamin D goal  is between 70-100.  Please make sure that you are taking your Vitamin D as directed.  It is very important as a natural anti-inflammatory  helping hair, skin, and nails, as well as reducing stroke and heart attack risk.  It helps your bones and helps with mood. It also  decreases numerous cancer risks so please take it as directed.  Low Vit D is associated with a 200-300% higher risk for CANCER  and 200-300% higher risk for HEART   ATTACK  &  STROKE.   ...................................... It is also associated with higher death rate at younger ages,  autoimmune diseases like Rheumatoid arthritis, Lupus, Multiple Sclerosis.    Also many other serious conditions, like depression, Alzheimer's Dementia, infertility, muscle aches, fatigue, fibromyalgia - just to name a few. +++++++++++++++++++++ Recommend the book "The END of DIETING" by Dr Joel Fuhrman  & the book "The END of DIABETES " by Dr Joel Fuhrman At [employer] - get book & Audio CD's    Being diabetic has a  300% increased risk for heart attack, stroke, cancer, and alzheimer- type vascular dementia. It is very important that you work harder with diet by avoiding all foods that are white. Avoid white rice (brown & wild rice is OK), white potatoes (sweetpotatoes in moderation is OK), White bread or wheat bread or anything made out of white flour like bagels, donuts, rolls, buns, biscuits, cakes, pastries, cookies, pizza crust, and pasta (made from white flour & egg whites) - vegetarian pasta or spinach or wheat pasta is OK. Multigrain breads like Arnold's or [employer], or multigrain sandwich thins or flatbreads.  Diet, exercise and weight loss can reverse and cure diabetes in the early stages.  Diet, exercise and weight loss is very important in the control and prevention of complications of diabetes which affects every system in your body, ie. Brain - dementia/stroke, eyes - glaucoma/blindness, heart - heart attack/heart failure, kidneys - dialysis, stomach - gastric paralysis, intestines - malabsorption,   nerves - severe painful neuritis, circulation - gangrene & loss of a leg(s), and finally cancer and Alzheimers.    I recommend avoid fried & greasy foods,  sweets/candy, white rice (brown or wild rice or  Quinoa is OK), white potatoes (sweet potatoes are OK) - anything made from white flour - bagels, doughnuts, rolls, buns, biscuits,white and wheat breads, pizza crust and traditional pasta made of white flour & egg white(vegetarian pasta or spinach or wheat pasta is OK).  Multi-grain bread is OK - like multi-grain flat bread or sandwich thins. Avoid alcohol in excess. Exercise is also important.    Eat all the vegetables you want - avoid meat, especially red meat and dairy - especially cheese.  Cheese is the most concentrated form of trans-fats which is the worst thing to clog up our arteries. Veggie cheese is OK which can be found in the fresh produce section at [employer] or [employer] or [employer]  ++++++++++++++++++++++ DASH Eating Plan  DASH stands for "Dietary Approaches to Stop Hypertension."   The DASH eating plan is a healthy eating plan that has been shown to reduce high blood pressure (hypertension). Additional health benefits may include reducing the risk of type 2 diabetes mellitus, heart disease, and stroke. The DASH eating plan may also help with weight loss. WHAT DO I NEED TO KNOW ABOUT THE DASH EATING PLAN? For the DASH eating plan, you will follow these general guidelines:  Choose foods with a percent daily value for sodium of less than 5% (as listed on the food label).  Use salt-free seasonings or herbs instead of table salt or sea salt.  Check with your health care provider or pharmacist before using salt substitutes.  Eat lower-sodium products, often labeled as "lower sodium" or "no salt added."  Eat fresh foods.  Eat more vegetables, fruits, and low-fat dairy products.  Choose whole grains. Look for the word "whole" as the first word in the ingredient list.  Choose fish   Limit sweets, desserts, sugars, and sugary drinks.  Choose heart-healthy fats.  Eat veggie cheese   Eat more home-cooked food and less restaurant, buffet, and fast food.  Limit fried  foods.  Cook foods using methods other than frying.  Limit canned vegetables. If you do use them, rinse them well to decrease the sodium.  When eating at a restaurant, ask that your food be prepared with less salt, or no salt if possible.                      WHAT FOODS CAN I EAT? Read Dr Joel Fuhrman's books on The End of Dieting & The End of Diabetes  Grains Whole grain or whole wheat bread. Brown rice. Whole grain or whole wheat pasta. Quinoa, bulgur, and whole grain cereals. Low-sodium cereals. Corn or whole wheat flour tortillas. Whole grain cornbread. Whole grain crackers. Low-sodium crackers.  Vegetables Fresh or frozen vegetables (raw, steamed, roasted, or grilled). Low-sodium or reduced-sodium tomato and vegetable juices. Low-sodium or reduced-sodium tomato sauce and paste. Low-sodium or reduced-sodium canned vegetables.   Fruits All fresh, canned (in natural juice), or frozen fruits.  Protein Products  All fish and seafood.  Dried beans, peas, or lentils. Unsalted nuts and seeds. Unsalted canned beans.  Dairy Low-fat dairy products, such as skim or 1% milk, 2% or reduced-fat cheeses, low-fat ricotta or cottage cheese, or plain low-fat yogurt. Low-sodium or reduced-sodium cheeses.  Fats and Oils Tub margarines without trans fats. Light or reduced-fat mayonnaise   and salad dressings (reduced sodium). Avocado. Safflower, olive, or canola oils. Natural peanut or almond butter.  Other Unsalted popcorn and pretzels. The items listed above may not be a complete list of recommended foods or beverages. Contact your dietitian for more options.  +++++++++++++++++++  WHAT FOODS ARE NOT RECOMMENDED? Grains/ White flour or wheat flour White bread. White pasta. White rice. Refined cornbread. Bagels and croissants. Crackers that contain trans fat.  Vegetables  Creamed or fried vegetables. Vegetables in a . Regular canned vegetables. Regular canned tomato sauce and paste. Regular  tomato and vegetable juices.  Fruits Dried fruits. Canned fruit in light or heavy syrup. Fruit juice.  Meat and Other Protein Products Meat in general - RED meat & White meat.  Fatty cuts of meat. Ribs, chicken wings, all processed meats as bacon, sausage, bologna, salami, fatback, hot dogs, bratwurst and packaged luncheon meats.  Dairy Whole or 2% milk, cream, half-and-half, and cream cheese. Whole-fat or sweetened yogurt. Full-fat cheeses or blue cheese. Non-dairy creamers and whipped toppings. Processed cheese, cheese spreads, or cheese curds.  Condiments Onion and garlic salt, seasoned salt, table salt, and sea salt. Canned and packaged gravies. Worcestershire sauce. Tartar sauce. Barbecue sauce. Teriyaki sauce. Soy sauce, including reduced sodium. Steak sauce. Fish sauce. Oyster sauce. Cocktail sauce. Horseradish. Ketchup and mustard. Meat flavorings and tenderizers. Bouillon cubes. Hot sauce. Tabasco sauce. Marinades. Taco seasonings. Relishes.  Fats and Oils Butter, stick margarine, lard, shortening and bacon fat. Coconut, palm kernel, or palm oils. Regular salad dressings.  Pickles and olives. Salted popcorn and pretzels.  The items listed above may not be a complete list of foods and beverages to avoid.    

## 2018-12-03 ENCOUNTER — Ambulatory Visit: Payer: BC Managed Care – PPO | Admitting: Internal Medicine

## 2018-12-03 ENCOUNTER — Encounter: Payer: Self-pay | Admitting: Internal Medicine

## 2018-12-03 ENCOUNTER — Other Ambulatory Visit: Payer: Self-pay

## 2018-12-03 VITALS — BP 132/84 | HR 56 | Temp 97.3°F | Resp 16 | Ht 68.0 in | Wt 204.8 lb

## 2018-12-03 DIAGNOSIS — E782 Mixed hyperlipidemia: Secondary | ICD-10-CM

## 2018-12-03 DIAGNOSIS — Z1389 Encounter for screening for other disorder: Secondary | ICD-10-CM | POA: Diagnosis not present

## 2018-12-03 DIAGNOSIS — E559 Vitamin D deficiency, unspecified: Secondary | ICD-10-CM | POA: Diagnosis not present

## 2018-12-03 DIAGNOSIS — Z111 Encounter for screening for respiratory tuberculosis: Secondary | ICD-10-CM | POA: Diagnosis not present

## 2018-12-03 DIAGNOSIS — Z125 Encounter for screening for malignant neoplasm of prostate: Secondary | ICD-10-CM

## 2018-12-03 DIAGNOSIS — Z136 Encounter for screening for cardiovascular disorders: Secondary | ICD-10-CM

## 2018-12-03 DIAGNOSIS — N401 Enlarged prostate with lower urinary tract symptoms: Secondary | ICD-10-CM | POA: Diagnosis not present

## 2018-12-03 DIAGNOSIS — Z130 Encounter for screening for diseases of the blood and blood-forming organs and certain disorders involving the immune mechanism: Secondary | ICD-10-CM

## 2018-12-03 DIAGNOSIS — I10 Essential (primary) hypertension: Secondary | ICD-10-CM | POA: Diagnosis not present

## 2018-12-03 DIAGNOSIS — Z79899 Other long term (current) drug therapy: Secondary | ICD-10-CM

## 2018-12-03 DIAGNOSIS — Z1329 Encounter for screening for other suspected endocrine disorder: Secondary | ICD-10-CM | POA: Diagnosis not present

## 2018-12-03 DIAGNOSIS — Z131 Encounter for screening for diabetes mellitus: Secondary | ICD-10-CM

## 2018-12-03 DIAGNOSIS — R7303 Prediabetes: Secondary | ICD-10-CM

## 2018-12-03 DIAGNOSIS — Z0000 Encounter for general adult medical examination without abnormal findings: Secondary | ICD-10-CM

## 2018-12-03 DIAGNOSIS — Z1211 Encounter for screening for malignant neoplasm of colon: Secondary | ICD-10-CM

## 2018-12-03 DIAGNOSIS — Z87891 Personal history of nicotine dependence: Secondary | ICD-10-CM

## 2018-12-03 DIAGNOSIS — Z8249 Family history of ischemic heart disease and other diseases of the circulatory system: Secondary | ICD-10-CM

## 2018-12-03 DIAGNOSIS — N138 Other obstructive and reflux uropathy: Secondary | ICD-10-CM

## 2018-12-03 DIAGNOSIS — Z13220 Encounter for screening for lipoid disorders: Secondary | ICD-10-CM

## 2018-12-03 DIAGNOSIS — Z0001 Encounter for general adult medical examination with abnormal findings: Secondary | ICD-10-CM

## 2018-12-03 DIAGNOSIS — R5383 Other fatigue: Secondary | ICD-10-CM

## 2018-12-03 DIAGNOSIS — R7309 Other abnormal glucose: Secondary | ICD-10-CM

## 2018-12-03 DIAGNOSIS — Z1212 Encounter for screening for malignant neoplasm of rectum: Secondary | ICD-10-CM

## 2018-12-04 LAB — URINALYSIS, ROUTINE W REFLEX MICROSCOPIC
Bilirubin Urine: NEGATIVE
Glucose, UA: NEGATIVE
Hgb urine dipstick: NEGATIVE
Ketones, ur: NEGATIVE
Leukocytes,Ua: NEGATIVE
Nitrite: NEGATIVE
Protein, ur: NEGATIVE
Specific Gravity, Urine: 1.023 (ref 1.001–1.03)
pH: <=5 (ref 5.0–8.0)

## 2018-12-04 LAB — COMPLETE METABOLIC PANEL WITH GFR
AG Ratio: 2 (calc) (ref 1.0–2.5)
ALT: 16 U/L (ref 9–46)
AST: 16 U/L (ref 10–35)
Albumin: 4.8 g/dL (ref 3.6–5.1)
Alkaline phosphatase (APISO): 67 U/L (ref 35–144)
BUN: 24 mg/dL (ref 7–25)
CO2: 28 mmol/L (ref 20–32)
Calcium: 9.7 mg/dL (ref 8.6–10.3)
Chloride: 106 mmol/L (ref 98–110)
Creat: 0.99 mg/dL (ref 0.70–1.33)
GFR, Est African American: 100 mL/min/{1.73_m2} (ref >=60)
GFR, Est Non African American: 87 mL/min/{1.73_m2} (ref >=60)
Globulin: 2.4 g/dL (calc) (ref 1.9–3.7)
Glucose, Bld: 94 mg/dL (ref 65–99)
Potassium: 4.2 mmol/L (ref 3.5–5.3)
Sodium: 139 mmol/L (ref 135–146)
Total Bilirubin: 0.4 mg/dL (ref 0.2–1.2)
Total Protein: 7.2 g/dL (ref 6.1–8.1)

## 2018-12-04 LAB — CBC WITH DIFFERENTIAL/PLATELET
Absolute Monocytes: 331 cells/uL (ref 200–950)
Basophils Absolute: 40 cells/uL (ref 0–200)
Basophils Relative: 1.1 %
Eosinophils Absolute: 40 cells/uL (ref 15–500)
Eosinophils Relative: 1.1 %
HCT: 44.7 % (ref 38.5–50.0)
Hemoglobin: 15.1 g/dL (ref 13.2–17.1)
Lymphs Abs: 1188 cells/uL (ref 850–3900)
MCH: 30.9 pg (ref 27.0–33.0)
MCHC: 33.8 g/dL (ref 32.0–36.0)
MCV: 91.4 fL (ref 80.0–100.0)
MPV: 9.7 fL (ref 7.5–12.5)
Monocytes Relative: 9.2 %
Neutro Abs: 2002 cells/uL (ref 1500–7800)
Neutrophils Relative %: 55.6 %
Platelets: 180 10*3/uL (ref 140–400)
RBC: 4.89 10*6/uL (ref 4.20–5.80)
RDW: 12.2 % (ref 11.0–15.0)
Total Lymphocyte: 33 %
WBC: 3.6 10*3/uL — ABNORMAL LOW (ref 3.8–10.8)

## 2018-12-04 LAB — IRON, TOTAL/TOTAL IRON BINDING CAP
%SAT: 17 % (calc) — ABNORMAL LOW (ref 20–48)
Iron: 52 ug/dL (ref 50–180)
TIBC: 306 mcg/dL (calc) (ref 250–425)

## 2018-12-04 LAB — LIPID PANEL
Cholesterol: 138 mg/dL (ref <200)
HDL: 41 mg/dL (ref >=40)
LDL Cholesterol (Calc): 85 mg/dL (calc)
Non-HDL Cholesterol (Calc): 97 mg/dL (calc) (ref <130)
Total CHOL/HDL Ratio: 3.4 (calc) (ref <5.0)
Triglycerides: 41 mg/dL (ref <150)

## 2018-12-04 LAB — TESTOSTERONE: Testosterone: 391 ng/dL (ref 250–827)

## 2018-12-04 LAB — INSULIN, RANDOM: Insulin: 6.1 u[IU]/mL

## 2018-12-04 LAB — VITAMIN D 25 HYDROXY (VIT D DEFICIENCY, FRACTURES): Vit D, 25-Hydroxy: 49 ng/mL (ref 30–100)

## 2018-12-04 LAB — TSH: TSH: 0.9 mIU/L (ref 0.40–4.50)

## 2018-12-04 LAB — VITAMIN B12: Vitamin B-12: 474 pg/mL (ref 200–1100)

## 2018-12-04 LAB — HEMOGLOBIN A1C: Hgb A1c MFr Bld: <4 % of total Hgb (ref <5.7)

## 2018-12-04 LAB — MAGNESIUM: Magnesium: 2.1 mg/dL (ref 1.5–2.5)

## 2018-12-04 LAB — PSA: PSA: 0.5 ng/mL (ref <=4.0)

## 2018-12-05 LAB — TB SKIN TEST
Induration: 0 mm
TB Skin Test: NEGATIVE

## 2019-04-15 ENCOUNTER — Encounter: Payer: Self-pay | Admitting: Internal Medicine

## 2019-04-15 NOTE — Progress Notes (Signed)
CANCELED

## 2019-04-16 ENCOUNTER — Ambulatory Visit: Payer: BC Managed Care – PPO | Admitting: Internal Medicine

## 2019-04-16 ENCOUNTER — Other Ambulatory Visit: Payer: Self-pay

## 2019-04-16 ENCOUNTER — Encounter: Payer: Self-pay | Admitting: Internal Medicine

## 2019-04-16 ENCOUNTER — Ambulatory Visit: Payer: BC Managed Care – PPO | Admitting: Adult Health Nurse Practitioner

## 2019-04-16 VITALS — BP 128/84 | HR 52 | Temp 97.4°F | Resp 16 | Ht 68.0 in | Wt 211.4 lb

## 2019-04-16 DIAGNOSIS — Z79899 Other long term (current) drug therapy: Secondary | ICD-10-CM | POA: Diagnosis not present

## 2019-04-16 DIAGNOSIS — E782 Mixed hyperlipidemia: Secondary | ICD-10-CM | POA: Diagnosis not present

## 2019-04-16 DIAGNOSIS — R7309 Other abnormal glucose: Secondary | ICD-10-CM

## 2019-04-16 DIAGNOSIS — E559 Vitamin D deficiency, unspecified: Secondary | ICD-10-CM

## 2019-04-16 DIAGNOSIS — I10 Essential (primary) hypertension: Secondary | ICD-10-CM

## 2019-04-16 NOTE — Patient Instructions (Signed)

## 2019-04-16 NOTE — Progress Notes (Signed)
History of Present Illness:      This very nice 53 y.o. MWM presents for 6 month follow up with HTN, HLD, Pre-Diabetes and Vitamin D Deficiency.       Patient is treated for HTN & BP has been controlled at home. Today's BP is at goal - 128/84. Patient has had no complaints of any cardiac type chest pain, palpitations, dyspnea / orthopnea / PND, dizziness, claudication, or dependent edema.  Hyperlipidemia is controlled with diet & meds. Patient denies myalgias or other med SE's. Last Lipids were  Lab Results  Component Value Date   CHOL 138 12/03/2018   HDL 41 12/03/2018   LDLCALC 85 12/03/2018   TRIG 41 12/03/2018   CHOLHDL 3.4 12/03/2018       Also, the patient has history of PreDiabetes and has had no symptoms of reactive hypoglycemia, diabetic polys, paresthesias or visual blurring.  Last A1c was  Lab Results  Component Value Date   HGBA1C <4.0 12/03/2018       Further, the patient also has history of Vitamin D Deficiency and supplements vitamin D without any suspected side-effects. Last vitamin D was not at goal (&0-100): Lab Results  Component Value Date   VD25OH 49 12/03/2018   Current Outpatient Medications on File Prior to Visit  Medication Sig  . atenolol (TENORMIN) 50 MG tablet TAKE 1 TABLET EVERY MORNING FOR BP   No current facility-administered medications on file prior to visit.    Allergies  Allergen Reactions  . Penicillins    PMHx:    Immunization History  Administered Date(s) Administered  . PPD Test 12/03/2018  . Tdap 11/09/2017   No past surgical history on file.  FHx:    Reviewed / unchanged  SHx:    Reviewed / unchanged   Systems Review:  Constitutional: Denies fever, chills, wt changes, headaches, insomnia, fatigue, night sweats, change in appetite. Eyes: Denies redness, blurred vision, diplopia, discharge, itchy, watery eyes.  ENT: Denies discharge, congestion, post nasal drip, epistaxis, sore throat, earache, hearing loss, dental  pain, tinnitus, vertigo, sinus pain, snoring.  CV: Denies chest pain, palpitations, irregular heartbeat, syncope, dyspnea, diaphoresis, orthopnea, PND, claudication or edema. Respiratory: denies cough, dyspnea, DOE, pleurisy, hoarseness, laryngitis, wheezing.  Gastrointestinal: Denies dysphagia, odynophagia, heartburn, reflux, water brash, abdominal pain or cramps, nausea, vomiting, bloating, diarrhea, constipation, hematemesis, melena, hematochezia  or hemorrhoids. Genitourinary: Denies dysuria, frequency, urgency, nocturia, hesitancy, discharge, hematuria or flank pain. Musculoskeletal: Denies arthralgias, myalgias, stiffness, jt. swelling, pain, limping or strain/sprain.  Skin: Denies pruritus, rash, hives, warts, acne, eczema or change in skin lesion(s). Neuro: No weakness, tremor, incoordination, spasms, paresthesia or pain. Psychiatric: Denies confusion, memory loss or sensory loss. Endo: Denies change in weight, skin or hair change.  Heme/Lymph: No excessive bleeding, bruising or enlarged lymph nodes.  Physical Exam  BP 128/84   Pulse (!) 52   Temp (!) 97.4 F (36.3 C)   Resp 16   Ht 5\' 8"  (1.727 m)   Wt 211 lb 6.4 oz (95.9 kg)   BMI 32.14 kg/m   Appears  well nourished, well groomed  and in no distress.  Eyes: PERRLA, EOMs, conjunctiva no swelling or erythema. Sinuses: No frontal/maxillary tenderness ENT/Mouth: EAC's clear, TM's nl w/o erythema, bulging. Nares clear w/o erythema, swelling, exudates. Oropharynx clear without erythema or exudates. Oral hygiene is good. Tongue normal, non obstructing. Hearing intact.  Neck: Supple. Thyroid not palpable. Car 2+/2+ without bruits, nodes or JVD. Chest: Respirations nl  with BS clear & equal w/o rales, rhonchi, wheezing or stridor.  Cor: Heart sounds normal w/ regular rate and rhythm without sig. murmurs, gallops, clicks or rubs. Peripheral pulses normal and equal  without edema.  Abdomen: Soft & bowel sounds normal. Non-tender w/o  guarding, rebound, hernias, masses or organomegaly.  Lymphatics: Unremarkable.  Musculoskeletal: Full ROM all peripheral extremities, joint stability, 5/5 strength and normal gait.  Skin: Warm, dry without exposed rashes, lesions or ecchymosis apparent.  Neuro: Cranial nerves intact, reflexes equal bilaterally. Sensory-motor testing grossly intact. Tendon reflexes grossly intact.  Pysch: Alert & oriented x 3.  Insight and judgement nl & appropriate. No ideations.  Assessment and Plan:  1. Essential hypertension  - Continue medication, monitor blood pressure at home.  - Continue DASH diet.  Reminder to go to the ER if any CP,  SOB, nausea, dizziness, severe HA, changes vision/speech.  - CBC with Differential/Platelet - COMPLETE METABOLIC PANEL WITH GFR - Magnesium - TSH  2. Hyperlipidemia, mixed  - Continue diet/meds, exercise,& lifestyle modifications.  - Continue monitor periodic cholesterol/liver & renal functions   - Lipid panel - TSH  3. Abnormal glucose  - Continue diet, exercise  - Lifestyle modifications.  - Monitor appropriate labs.  - Hemoglobin A1c - Insulin, random  4. Vitamin D deficiency  - Continue supplementation.  - VITAMIN D 25 Hydroxyl  5. Medication management  - CBC with Differential/Platelet - COMPLETE METABOLIC PANEL WITH GFR - Magnesium - Lipid panel - TSH - Hemoglobin A1c - Insulin, random - VITAMIN D 25 Hydroxyl       Discussed  regular exercise, BP monitoring, weight control to achieve/maintain BMI less than 25 and discussed med and SE's. Recommended labs to assess and monitor clinical status with further disposition pending results of labs.  I discussed the assessment and treatment plan with the patient. The patient was provided an opportunity to ask questions and all were answered. The patient agreed with the plan and demonstrated an understanding of the instructions.  I provided over 30 minutes of exam, counseling, chart review and   complex critical decision making.  Kirtland Bouchard, MD

## 2019-04-17 LAB — COMPLETE METABOLIC PANEL WITH GFR
AG Ratio: 2.2 (calc) (ref 1.0–2.5)
ALT: 22 U/L (ref 9–46)
AST: 18 U/L (ref 10–35)
Albumin: 4.8 g/dL (ref 3.6–5.1)
Alkaline phosphatase (APISO): 78 U/L (ref 35–144)
BUN: 22 mg/dL (ref 7–25)
CO2: 28 mmol/L (ref 20–32)
Calcium: 10 mg/dL (ref 8.6–10.3)
Chloride: 106 mmol/L (ref 98–110)
Creat: 0.83 mg/dL (ref 0.70–1.33)
GFR, Est African American: 116 mL/min/{1.73_m2} (ref >=60)
GFR, Est Non African American: 100 mL/min/{1.73_m2} (ref >=60)
Globulin: 2.2 g/dL (calc) (ref 1.9–3.7)
Glucose, Bld: 93 mg/dL (ref 65–99)
Potassium: 4.9 mmol/L (ref 3.5–5.3)
Sodium: 142 mmol/L (ref 135–146)
Total Bilirubin: 0.3 mg/dL (ref 0.2–1.2)
Total Protein: 7 g/dL (ref 6.1–8.1)

## 2019-04-17 LAB — CBC WITH DIFFERENTIAL/PLATELET
Absolute Monocytes: 348 cells/uL (ref 200–950)
Basophils Absolute: 40 cells/uL (ref 0–200)
Basophils Relative: 0.9 %
Eosinophils Absolute: 40 cells/uL (ref 15–500)
Eosinophils Relative: 0.9 %
HCT: 46.4 % (ref 38.5–50.0)
Hemoglobin: 15.5 g/dL (ref 13.2–17.1)
Lymphs Abs: 1280 cells/uL (ref 850–3900)
MCH: 31.5 pg (ref 27.0–33.0)
MCHC: 33.4 g/dL (ref 32.0–36.0)
MCV: 94.3 fL (ref 80.0–100.0)
MPV: 10 fL (ref 7.5–12.5)
Monocytes Relative: 7.9 %
Neutro Abs: 2693 cells/uL (ref 1500–7800)
Neutrophils Relative %: 61.2 %
Platelets: 165 10*3/uL (ref 140–400)
RBC: 4.92 10*6/uL (ref 4.20–5.80)
RDW: 12.7 % (ref 11.0–15.0)
Total Lymphocyte: 29.1 %
WBC: 4.4 10*3/uL (ref 3.8–10.8)

## 2019-04-17 LAB — TSH: TSH: 0.94 mIU/L (ref 0.40–4.50)

## 2019-04-17 LAB — VITAMIN D 25 HYDROXY (VIT D DEFICIENCY, FRACTURES): Vit D, 25-Hydroxy: 41 ng/mL (ref 30–100)

## 2019-04-17 LAB — LIPID PANEL
Cholesterol: 137 mg/dL (ref <200)
HDL: 42 mg/dL (ref >=40)
LDL Cholesterol (Calc): 82 mg/dL (calc)
Non-HDL Cholesterol (Calc): 95 mg/dL (calc) (ref <130)
Total CHOL/HDL Ratio: 3.3 (calc) (ref <5.0)
Triglycerides: 46 mg/dL (ref <150)

## 2019-04-17 LAB — INSULIN, RANDOM: Insulin: 16.2 u[IU]/mL

## 2019-04-17 LAB — HEMOGLOBIN A1C: Hgb A1c MFr Bld: <4 % of total Hgb (ref <5.7)

## 2019-04-17 LAB — MAGNESIUM: Magnesium: 2 mg/dL (ref 1.5–2.5)

## 2019-05-12 NOTE — Progress Notes (Signed)
   Subjective:    Patient ID: Derek Diaz, male    DOB: May 24, 1966, 53 y.o.   MRN: [id number]  HPI      This very nice 53 y.o. MWM  with HTN, HLD, Pre-Diabetes and Vitamin D Deficiency who denies any sx's of HA's , dizziness, CP, palpitations, dyspnea or edema. He also has concerns rea growth lateral to his Rt eye that he perceives in his peripheral vision and also a lesion of his Rt posterior shoulder.   Outpatient Medications Prior to Visit  Medication Sig Dispense Refill  . atenolol (TENORMIN) 50 MG tablet TAKE 1 TABLET EVERY MORNING FOR BP 90 tablet 1  . OVER THE COUNTER MEDICATION Takes OTC iron 1 tablet daily.     No facility-administered medications prior to visit.    Allergies  Allergen Reactions  . Penicillins        10 point systems review negative except as above.    Objective:   Physical Exam  BP 124/76   Pulse 76   Temp 97.9 F (36.6 C)   Resp 16   Ht 5\' 8"  (1.727 m)   Wt 210 lb 12.8 oz (95.6 kg)   BMI 32.05 kg/m   HEENT - WNL. Neck - supple.  Chest - Clear equal BS. Cor - Nl HS. RRR w/o sig MGR. PP 1(+). No edema. MS- FROM w/o deformities.  Gait Nl. Neuro -  Nl w/o focal abnormalities. Skin- There is an 8 mm filamentous skin projection 1/2"  lateral to his Rt eye. Also there is a red blanching 5 x 8 mm flat lesion of his Rt posterior shoulder that concerns him.   Procedure (CPT - 17000)      After informed consent and aseptic prep with alcohol the areas were both anesthetized with Marcaine 0.5%. Then each lesion was hyfrecated for electrodestruction & antibiotic dressing and sterile Band-Aid were applied. Patient instructed in wound care.    Assessment & Plan:   1. Labile hypertension  2. Neoplasm of uncertain behavior of skin

## 2019-05-13 ENCOUNTER — Ambulatory Visit: Payer: BC Managed Care – PPO | Admitting: Internal Medicine

## 2019-05-13 ENCOUNTER — Encounter: Payer: Self-pay | Admitting: Internal Medicine

## 2019-05-13 ENCOUNTER — Other Ambulatory Visit: Payer: Self-pay

## 2019-05-13 VITALS — BP 124/76 | HR 76 | Temp 97.9°F | Resp 16 | Ht 68.0 in | Wt 210.8 lb

## 2019-05-13 DIAGNOSIS — D485 Neoplasm of uncertain behavior of skin: Secondary | ICD-10-CM

## 2019-05-13 DIAGNOSIS — R0989 Other specified symptoms and signs involving the circulatory and respiratory systems: Secondary | ICD-10-CM

## 2019-10-02 ENCOUNTER — Other Ambulatory Visit: Payer: Self-pay

## 2019-10-02 ENCOUNTER — Ambulatory Visit (INDEPENDENT_AMBULATORY_CARE_PROVIDER_SITE_OTHER): Payer: BC Managed Care – PPO | Admitting: Internal Medicine

## 2019-10-02 ENCOUNTER — Encounter: Payer: Self-pay | Admitting: Internal Medicine

## 2019-10-02 VITALS — BP 142/84 | HR 64 | Temp 96.3°F | Resp 16 | Ht 68.0 in | Wt 218.0 lb

## 2019-10-02 DIAGNOSIS — I10 Essential (primary) hypertension: Secondary | ICD-10-CM

## 2019-10-02 DIAGNOSIS — F419 Anxiety disorder, unspecified: Secondary | ICD-10-CM | POA: Diagnosis not present

## 2019-10-02 MED ORDER — ATENOLOL 50 MG PO TABS: ORAL_TABLET | ORAL | 3 refills | Status: DC

## 2019-10-02 MED ORDER — ESCITALOPRAM OXALATE 20 MG PO TABS: ORAL_TABLET | ORAL | 3 refills | Status: DC

## 2019-10-02 NOTE — Progress Notes (Signed)
    Subjective:    Patient ID: Derek Diaz, male    DOB: 24-Oct-1965, 54 y.o.   MRN: [id number]  HPI  Patient is a very nice 54 yo MWM with labile HTN who presents relating being under increased stress at work and recent BP have been elevated ranging up to 176/104, 179/109, 163/93. He has had increased HA's which he describes as right sided in the parietal/occipital areas. Denies any visual or neuro sx's. He does admit being more irritable.  Outpatient Medications Prior to Visit  Medication Sig Dispense Refill  . OVER THE COUNTER MEDICATION Takes OTC iron 1 tablet daily.    Marland Kitchen atenolol (TENORMIN) 50 MG tablet TAKE 1 TABLET EVERY MORNING FOR BP 90 tablet 1   No facility-administered medications prior to visit.   Allergies  Allergen Reactions  . Penicillins     Review of Systems   10 point systems review negative except as above.    Objective:   Physical Exam  BP  142/84   P 64   T  96.3 F   R 16   Ht 5\' 8"     Wt 218 lb BMI 33.15   HEENT - WNL. Neck - supple.  Chest - Clear equal BS. Cor - Nl HS. RRR w/o sig MGR. PP 1(+). No edema. MS- FROM w/o deformities.  Gait Nl. Neuro -  Nl w/o focal abnormalities.    Assessment & Plan:    1. Essential hypertension  - atenolol (TENORMIN) 50 MG tablet; Take 1 tablet Daily for BP  Dispense: 90 tablet; Refill: 3  2. Anxiety tension state  - escitalopram (LEXAPRO) 20 MG tablet; Take 1 tablet Daily for Mood  Dispense: 90 tablet; Refill: 3  - ROV 1 month

## 2019-10-05 ENCOUNTER — Ambulatory Visit: Payer: Self-pay | Attending: Internal Medicine

## 2019-10-05 DIAGNOSIS — Z23 Encounter for immunization: Secondary | ICD-10-CM | POA: Insufficient documentation

## 2019-10-05 NOTE — Progress Notes (Signed)
   Covid-19 Vaccination Clinic  Name:  Derek Diaz    MRN: [id number] DOB: Jun 19, 1966  10/05/2019  [name] was observed post Covid-19 immunization for 15 minutes without incidence. He was provided with Vaccine Information Sheet and instruction to access the V-Safe system.   [name] was instructed to call 911 with any severe reactions post vaccine: Marland Kitchen Difficulty breathing  . Swelling of your face and throat  . A fast heartbeat  . A bad rash all over your body  . Dizziness and weakness    Immunizations Administered    Name Date Dose VIS Date Route   Pfizer COVID-19 Vaccine 10/05/2019  6:11 PM 0.3 mL 07/19/2019 Intramuscular   Manufacturer: [place]   Lot: [id number]   [place]: [id number]

## 2019-10-26 ENCOUNTER — Ambulatory Visit: Payer: Self-pay

## 2019-10-28 ENCOUNTER — Ambulatory Visit: Payer: Self-pay

## 2019-10-29 ENCOUNTER — Ambulatory Visit: Payer: Self-pay | Attending: Internal Medicine

## 2019-10-29 DIAGNOSIS — Z23 Encounter for immunization: Secondary | ICD-10-CM

## 2019-10-29 NOTE — Progress Notes (Signed)
   Covid-19 Vaccination Clinic  Name:  Derek Diaz    MRN: [id number] DOB: [birth year]  10/29/2019  Derek Diaz was observed post Covid-19 immunization for 15 minutes without incident. He was provided with Vaccine Information Sheet and instruction to access the V-Safe system.   Derek Diaz was instructed to call 911 with any severe reactions post vaccine: Marland Kitchen Difficulty breathing  . Swelling of face and throat  . A fast heartbeat  . A bad rash all over body  . Dizziness and weakness   Immunizations Administered    Name Date Dose VIS Date Route   Pfizer COVID-19 Vaccine 10/29/2019  3:25 PM 0.3 mL 07/19/2019 Intramuscular   Manufacturer: [place]   Lot: [id number]   [place]: [id number]

## 2019-10-31 ENCOUNTER — Encounter: Payer: Self-pay | Admitting: Internal Medicine

## 2019-10-31 NOTE — Patient Instructions (Signed)

## 2019-10-31 NOTE — Progress Notes (Signed)
Patient ID: Derek Diaz, male   DOB: Feb 13, 1966, 54 y.o.   MRN: [id number]

## 2019-10-31 NOTE — Progress Notes (Signed)
   Subjective:    Patient ID: Derek Diaz, male    DOB: 11/09/1965, 54 y.o.   MRN: [id number]  HPI    This very nice 54 yo MWM with Labile HTN returns for 1 month  f/u after starting Atenolol 50 mg & Lexapro 20 mg for elevated BP's 163-179/93-109. Reports BPS are much better and because of fatigue decreased his Atenolol dose to 1/2 tab (25 mg) and BP's continued to remain Normal. He would like to decrease the Lexapro dose to 1/2 tablet .   Medication Sig  . atenolol (TENORMIN) 50 MG tablet Take 1 tablet Daily for BP  . escitalopram (LEXAPRO) 20 MG tablet Take 1 tablet Daily for Mood  . OTC iron Takes  1 tablet daily.    Allergies  Allergen Reactions  . Penicillins     Review of Systems   10 point systems review negative except as above.    Objective:   Physical Exam  BP 122/74   Pulse 64   Temp (!) 97 F (36.1 C)   Resp 16   Ht 5\' 8"  (1.727 m)   Wt 217 lb 3.2 oz (98.5 kg)   BMI 33.03 kg/m   HEENT - WNL. Neck - supple.  Chest - Clear equal BS. Cor - Nl HS. RRR w/o sig MGR. PP 1(+). No edema. MS- FROM w/o deformities.  Gait Nl. Neuro -  Nl w/o focal abnormalities.    Assessment & Plan:   1. Essential hypertension  - Continue Atenol 50 mg   x 1/2 tab = 25 mg Daily   2. Anxiety tension state  - OK'd to cut Lexapro dose in 1/2 - 10 mg daily   - has CPE in May

## 2019-11-01 ENCOUNTER — Ambulatory Visit (INDEPENDENT_AMBULATORY_CARE_PROVIDER_SITE_OTHER): Payer: BC Managed Care – PPO | Admitting: Internal Medicine

## 2019-11-01 ENCOUNTER — Other Ambulatory Visit: Payer: Self-pay

## 2019-11-01 VITALS — BP 122/74 | HR 64 | Temp 97.0°F | Resp 16 | Ht 68.0 in | Wt 217.2 lb

## 2019-11-01 DIAGNOSIS — F419 Anxiety disorder, unspecified: Secondary | ICD-10-CM

## 2019-11-01 DIAGNOSIS — I10 Essential (primary) hypertension: Secondary | ICD-10-CM

## 2019-12-08 ENCOUNTER — Encounter: Payer: Self-pay | Admitting: Internal Medicine

## 2019-12-08 NOTE — Progress Notes (Signed)
Annual  Screening/Preventative Visit  & Comprehensive Evaluation & Examination     This very nice 54 y.o. MWM  presents for a Screening /Preventative Visit & comprehensive evaluation and management of multiple medical co-morbidities.  Patient has been followed for HTN, HLD, Prediabetes and Vitamin D Deficiency.     Patient has tapered to 1/2 dose Lexapro & desires to taper & stop over the next 2-3 weeks. He does report difficulty with focus and desires to try a low dose of Adderall .     HTN predates circa April 2019. Patient's BP has been controlled at home.  Today's BP is at goal - 116/74. Patient denies any cardiac symptoms as chest pain, palpitations, shortness of breath, dizziness or ankle swelling. Patient reports regular exercise 5+ x /week.      Patient's hyperlipidemia is controlled with diet. Last lipids were at goal:  Lab Results  Component Value Date   CHOL 137 04/16/2019   HDL 42 04/16/2019   LDLCALC 82 04/16/2019   TRIG 46 04/16/2019   CHOLHDL 3.3 04/16/2019       Patient has hx/o abn glucose & is monitored expectantly for glucose intolerance  and patient denies reactive hypoglycemic symptoms, visual blurring, diabetic polys or paresthesias. Last A1c was at goal: Lab Results  Component Value Date   HGBA1C <4.0 04/16/2019    Wt Readings from Last 3 Encounters:  12/09/19 217 lb 6.4 oz (98.6 kg)  11/01/19 217 lb 3.2 oz (98.5 kg)  10/02/19 218 lb (98.9 kg)  11/09/2017      221 lb      Finally, patient has history of Vitamin D Deficiency and last vitamin D was still low (goal 70-100):  Lab Results  Component Value Date   VD25OH 41 04/16/2019    Current Outpatient Medications on File Prior to Visit  Medication Sig  . Ascorbic Acid (VITAMIN C) 500 MG CAPS Take 1 capsule by mouth daily.  Marland Kitchen atenolol (TENORMIN) 50 MG tablet Take 1 tablet Daily for BP  . Cholecalciferol (VITAMIN D) 125 MCG (5000 UT) CAPS Take 2 capsules by mouth daily.  Marland Kitchen escitalopram (LEXAPRO) 20  MG tablet Take 1 tablet Daily for Mood  . Multiple Vitamin (MULTIVITAMIN) tablet Take 1 tablet by mouth daily.  Marland Kitchen OVER THE COUNTER MEDICATION Takes OTC iron 1 tablet daily.  Marland Kitchen zinc gluconate 50 MG tablet Take 50 mg by mouth daily.   No current facility-administered medications on file prior to visit.   Allergies  Allergen Reactions  . Penicillins    History reviewed. No pertinent past medical history.   Health Maintenance  Topic Date Due  . HIV Screening  Never done  . COLONOSCOPY  Never done  . INFLUENZA VACCINE  03/08/2020  . TETANUS/TDAP  11/10/2027  . COVID-19 Vaccine  Completed   Immunization History  Administered Date(s) Administered  . Influenza, Quadrivalent, Recombinant, Inj, Pf 08/05/2019  . PFIZER SARS-COV-2 Vaccination 10/05/2019, 10/29/2019  . PPD Test 12/03/2018  . Tdap 11/09/2017   Last Colon - Never - No FHx & is agreeable to do Cologard  History reviewed. No pertinent surgical history.   Family History  Problem Relation Age of Onset  . Stroke Mother   . Heart disease Mother   . Stroke Father   . Hypertension Father   . Heart disease Father   . Hypertension Brother    Social History   Socioeconomic History  . Marital status: Married    Spouse name: Beverlee Nims  . Number of  children: 1 son & 1 daughter a a grand son "on the way"  Occupational History  . [employer]  Tobacco Use  . Smoking status: Former Smoker    Quit date: 10/17/2016    Years since quitting: 3.1  . Smokeless tobacco: Never Used  Substance and Sexual Activity  . Alcohol use: Never  . Drug use: Never  . Sexual activity: Yes     ROS Constitutional: Denies fever, chills, weight loss/gain, headaches, insomnia,  night sweats or change in appetite. Does c/o fatigue. Eyes: Denies redness, blurred vision, diplopia, discharge, itchy or watery eyes.  ENT: Denies discharge, congestion, post nasal drip, epistaxis, sore throat, earache, hearing loss, dental pain,  Tinnitus, Vertigo, Sinus pain or snoring.  Cardio: Denies chest pain, palpitations, irregular heartbeat, syncope, dyspnea, diaphoresis, orthopnea, PND, claudication or edema Respiratory: denies cough, dyspnea, DOE, pleurisy, hoarseness, laryngitis or wheezing.  Gastrointestinal: Denies dysphagia, heartburn, reflux, water brash, pain, cramps, nausea, vomiting, bloating, diarrhea, constipation, hematemesis, melena, hematochezia, jaundice or hemorrhoids Genitourinary: Denies dysuria, frequency, urgency, nocturia, hesitancy, discharge, hematuria or flank pain Musculoskeletal: Denies arthralgia, myalgia, stiffness, Jt. Swelling, pain, limp or strain/sprain. Denies Falls. Skin: Denies puritis, rash, hives, warts, acne, eczema or change in skin lesion Neuro: No weakness, tremor, incoordination, spasms, paresthesia or pain Psychiatric: Denies confusion, memory loss or sensory loss. Denies Depression. Endocrine: Denies change in weight, skin, hair change, nocturia, and paresthesia, diabetic polys, visual blurring or hyper / hypo glycemic episodes.  Heme/Lymph: No excessive bleeding, bruising or enlarged lymph nodes.  Physical Exam  BP 116/74   Pulse (!) 52   Temp (!) 97 F (36.1 C)   Resp 16   Ht 5\' 8"  (1.727 m)   Wt 217 lb 6.4 oz (98.6 kg)   BMI 33.06 kg/m   General Appearance: Over nourished and well groomed and in no apparent distress.  Eyes: PERRLA, EOMs, conjunctiva no swelling or erythema, normal fundi and vessels. Sinuses: No frontal/maxillary tenderness ENT/Mouth: EACs patent / TMs  nl. Nares clear without erythema, swelling, mucoid exudates. Oral hygiene is good. No erythema, swelling, or exudate. Tongue normal, non-obstructing. Tonsils not swollen or erythematous. Hearing normal.  Neck: Supple, thyroid not palpable. No bruits, nodes or JVD. Respiratory: Respiratory effort normal.  BS equal and clear bilateral without rales, rhonci, wheezing or stridor. Cardio: Heart sounds are normal  with regular rate and rhythm and no murmurs, rubs or gallops. Peripheral pulses are normal and equal bilaterally without edema. No aortic or femoral bruits. Chest: symmetric with normal excursions and percussion.  Abdomen: Soft, with Nl bowel sounds. Nontender, no guarding, rebound, hernias, masses, or organomegaly.  Lymphatics: Non tender without lymphadenopathy.  Musculoskeletal: Full ROM all peripheral extremities, joint stability, 5/5 strength, and normal gait. Skin: Warm and dry without rashes, lesions, cyanosis, clubbing or  ecchymosis.  Neuro: Cranial nerves intact, reflexes equal bilaterally. Normal muscle tone, no cerebellar symptoms. Sensation intact.  Pysch: Alert and oriented X 3 with normal affect, insight and judgment appropriate.   Assessment and Plan  1. Annual Preventative/Screening Exam   2. Essential hypertension  - EKG 12-Lead - [place], RETROPERITNL ABD,  LTD - Urinalysis, Routine w reflex microscopic - Microalbumin / creatinine urine ratio - CBC with Differential/Platelet - COMPLETE METABOLIC PANEL WITH GFR - Magnesium - TSH  3. Hyperlipidemia, mixed  - EKG 12-Lead - [place], RETROPERITNL ABD,  LTD - Lipid panel - TSH  4. Abnormal glucose  - EKG 12-Lead - [place], RETROPERITNL ABD,  LTD - Hemoglobin A1c -  Insulin, random  5. Vitamin D deficiency  - VITAMIN D 25 Hydroxy  6. BPH with obstruction/lower urinary tract symptoms  - PSA  7. Prostate cancer screening  - PSA  8. Screening for colorectal cancer  - Cologard  9. Screening-pulmonary TB  - TB Skin Test  10. Screening for ischemic heart disease  - EKG 12-Lead  11. FHx: heart disease  - EKG 12-Lead - [place], RETROPERITNL ABD,  LTD  12. Former smoker  - EKG 12-Lead - [place], RETROPERITNL ABD,  LTD  13. FH: heart disease  - EKG 12-Lead - [place], RETROPERITNL ABD,  LTD  14. Screening for AAA (aortic abdominal aneurysm)  - [place], RETROPERITNL ABD,  LTD  15. Fatigue, unspecified type  -  Iron,Total/Total Iron Binding Cap - Vitamin B12 - Testosterone - CBC with Differential/Platelet - TSH  16. Medication management  - Urinalysis, Routine w reflex microscopic - Microalbumin / creatinine urine ratio - TB Skin Test - CBC with Differential/Platelet - COMPLETE METABOLIC PANEL WITH GFR - Magnesium - Lipid panel - TSH - Hemoglobin A1c - Insulin, random - VITAMIN D 25 Hydroxy         Patient was counseled in prudent diet, weight control to achieve/maintain BMI less than 25, BP monitoring, regular exercise and medications as discussed.  Discussed med effects and SE's. Routine screening labs and tests as requested with regular follow-up as recommended. Over 40 minutes of exam, counseling, chart review and high complex critical decision making was performed   Kirtland Bouchard, MD

## 2019-12-08 NOTE — Patient Instructions (Signed)

## 2019-12-09 ENCOUNTER — Ambulatory Visit (INDEPENDENT_AMBULATORY_CARE_PROVIDER_SITE_OTHER): Payer: BC Managed Care – PPO | Admitting: Internal Medicine

## 2019-12-09 ENCOUNTER — Other Ambulatory Visit: Payer: Self-pay

## 2019-12-09 VITALS — BP 116/74 | HR 52 | Temp 97.0°F | Resp 16 | Ht 68.0 in | Wt 217.4 lb

## 2019-12-09 DIAGNOSIS — R5383 Other fatigue: Secondary | ICD-10-CM

## 2019-12-09 DIAGNOSIS — Z8249 Family history of ischemic heart disease and other diseases of the circulatory system: Secondary | ICD-10-CM | POA: Diagnosis not present

## 2019-12-09 DIAGNOSIS — I10 Essential (primary) hypertension: Secondary | ICD-10-CM | POA: Diagnosis not present

## 2019-12-09 DIAGNOSIS — Z136 Encounter for screening for cardiovascular disorders: Secondary | ICD-10-CM

## 2019-12-09 DIAGNOSIS — Z1329 Encounter for screening for other suspected endocrine disorder: Secondary | ICD-10-CM | POA: Diagnosis not present

## 2019-12-09 DIAGNOSIS — Z125 Encounter for screening for malignant neoplasm of prostate: Secondary | ICD-10-CM | POA: Diagnosis not present

## 2019-12-09 DIAGNOSIS — Z1211 Encounter for screening for malignant neoplasm of colon: Secondary | ICD-10-CM

## 2019-12-09 DIAGNOSIS — Z130 Encounter for screening for diseases of the blood and blood-forming organs and certain disorders involving the immune mechanism: Secondary | ICD-10-CM | POA: Diagnosis not present

## 2019-12-09 DIAGNOSIS — N138 Other obstructive and reflux uropathy: Secondary | ICD-10-CM

## 2019-12-09 DIAGNOSIS — Z1212 Encounter for screening for malignant neoplasm of rectum: Secondary | ICD-10-CM

## 2019-12-09 DIAGNOSIS — E559 Vitamin D deficiency, unspecified: Secondary | ICD-10-CM | POA: Diagnosis not present

## 2019-12-09 DIAGNOSIS — Z131 Encounter for screening for diabetes mellitus: Secondary | ICD-10-CM | POA: Diagnosis not present

## 2019-12-09 DIAGNOSIS — R7309 Other abnormal glucose: Secondary | ICD-10-CM

## 2019-12-09 DIAGNOSIS — Z13220 Encounter for screening for lipoid disorders: Secondary | ICD-10-CM | POA: Diagnosis not present

## 2019-12-09 DIAGNOSIS — E782 Mixed hyperlipidemia: Secondary | ICD-10-CM

## 2019-12-09 DIAGNOSIS — Z79899 Other long term (current) drug therapy: Secondary | ICD-10-CM

## 2019-12-09 DIAGNOSIS — Z87891 Personal history of nicotine dependence: Secondary | ICD-10-CM

## 2019-12-09 DIAGNOSIS — Z1389 Encounter for screening for other disorder: Secondary | ICD-10-CM

## 2019-12-09 DIAGNOSIS — R350 Frequency of micturition: Secondary | ICD-10-CM

## 2019-12-09 DIAGNOSIS — F900 Attention-deficit hyperactivity disorder, predominantly inattentive type: Secondary | ICD-10-CM

## 2019-12-09 DIAGNOSIS — Z0001 Encounter for general adult medical examination with abnormal findings: Secondary | ICD-10-CM

## 2019-12-09 DIAGNOSIS — Z0000 Encounter for general adult medical examination without abnormal findings: Secondary | ICD-10-CM

## 2019-12-09 DIAGNOSIS — N401 Enlarged prostate with lower urinary tract symptoms: Secondary | ICD-10-CM | POA: Diagnosis not present

## 2019-12-09 DIAGNOSIS — Z111 Encounter for screening for respiratory tuberculosis: Secondary | ICD-10-CM

## 2019-12-09 MED ORDER — AMPHETAMINE-DEXTROAMPHETAMINE 20 MG PO TABS: ORAL_TABLET | ORAL | 0 refills | Status: DC

## 2019-12-10 ENCOUNTER — Telehealth: Payer: Self-pay | Admitting: *Deleted

## 2019-12-10 LAB — COMPLETE METABOLIC PANEL WITH GFR
AG Ratio: 2.2 (calc) (ref 1.0–2.5)
ALT: 17 U/L (ref 9–46)
AST: 16 U/L (ref 10–35)
Albumin: 4.6 g/dL (ref 3.6–5.1)
Alkaline phosphatase (APISO): 70 U/L (ref 35–144)
BUN/Creatinine Ratio: 31 (calc) — ABNORMAL HIGH (ref 6–22)
BUN: 26 mg/dL — ABNORMAL HIGH (ref 7–25)
CO2: 26 mmol/L (ref 20–32)
Calcium: 9.5 mg/dL (ref 8.6–10.3)
Chloride: 107 mmol/L (ref 98–110)
Creat: 0.85 mg/dL (ref 0.70–1.33)
GFR, Est African American: 114 mL/min/{1.73_m2} (ref >=60)
GFR, Est Non African American: 99 mL/min/{1.73_m2} (ref >=60)
Globulin: 2.1 g/dL (calc) (ref 1.9–3.7)
Glucose, Bld: 89 mg/dL (ref 65–99)
Potassium: 4.6 mmol/L (ref 3.5–5.3)
Sodium: 139 mmol/L (ref 135–146)
Total Bilirubin: 0.7 mg/dL (ref 0.2–1.2)
Total Protein: 6.7 g/dL (ref 6.1–8.1)

## 2019-12-10 LAB — HEMOGLOBIN A1C
Hgb A1c MFr Bld: 4.3 % of total Hgb (ref <5.7)
Mean Plasma Glucose: 77 (calc)
eAG (mmol/L): 4.2 (calc)

## 2019-12-10 LAB — URINALYSIS, ROUTINE W REFLEX MICROSCOPIC
Bilirubin Urine: NEGATIVE
Glucose, UA: NEGATIVE
Hgb urine dipstick: NEGATIVE
Ketones, ur: NEGATIVE
Leukocytes,Ua: NEGATIVE
Nitrite: NEGATIVE
Protein, ur: NEGATIVE
Specific Gravity, Urine: 1.025 (ref 1.001–1.03)
pH: 6 (ref 5.0–8.0)

## 2019-12-10 LAB — CBC WITH DIFFERENTIAL/PLATELET
Absolute Monocytes: 323 cells/uL (ref 200–950)
Basophils Absolute: 30 cells/uL (ref 0–200)
Basophils Relative: 0.8 %
Eosinophils Absolute: 49 cells/uL (ref 15–500)
Eosinophils Relative: 1.3 %
HCT: 38.3 % — ABNORMAL LOW (ref 38.5–50.0)
Hemoglobin: 12.9 g/dL — ABNORMAL LOW (ref 13.2–17.1)
Lymphs Abs: 1098 cells/uL (ref 850–3900)
MCH: 31 pg (ref 27.0–33.0)
MCHC: 33.7 g/dL (ref 32.0–36.0)
MCV: 92.1 fL (ref 80.0–100.0)
MPV: 10.1 fL (ref 7.5–12.5)
Monocytes Relative: 8.5 %
Neutro Abs: 2299 cells/uL (ref 1500–7800)
Neutrophils Relative %: 60.5 %
Platelets: 168 10*3/uL (ref 140–400)
RBC: 4.16 10*6/uL — ABNORMAL LOW (ref 4.20–5.80)
RDW: 13 % (ref 11.0–15.0)
Total Lymphocyte: 28.9 %
WBC: 3.8 10*3/uL (ref 3.8–10.8)

## 2019-12-10 LAB — MICROALBUMIN / CREATININE URINE RATIO
Creatinine, Urine: 111 mg/dL (ref 20–320)
Microalb Creat Ratio: 4 mcg/mg creat (ref <30)
Microalb, Ur: 0.4 mg/dL

## 2019-12-10 LAB — IRON, TOTAL/TOTAL IRON BINDING CAP
%SAT: 38 % (calc) (ref 20–48)
Iron: 112 ug/dL (ref 50–180)
TIBC: 291 mcg/dL (calc) (ref 250–425)

## 2019-12-10 LAB — LIPID PANEL
Cholesterol: 122 mg/dL (ref <200)
HDL: 37 mg/dL — ABNORMAL LOW (ref >=40)
LDL Cholesterol (Calc): 71 mg/dL (calc)
Non-HDL Cholesterol (Calc): 85 mg/dL (calc) (ref <130)
Total CHOL/HDL Ratio: 3.3 (calc) (ref <5.0)
Triglycerides: 59 mg/dL (ref <150)

## 2019-12-10 LAB — TSH: TSH: 0.84 mIU/L (ref 0.40–4.50)

## 2019-12-10 LAB — MAGNESIUM: Magnesium: 2.2 mg/dL (ref 1.5–2.5)

## 2019-12-10 LAB — TESTOSTERONE: Testosterone: 355 ng/dL (ref 250–827)

## 2019-12-10 LAB — VITAMIN B12: Vitamin B-12: 554 pg/mL (ref 200–1100)

## 2019-12-10 LAB — PSA: PSA: 0.5 ng/mL (ref <=4.0)

## 2019-12-10 LAB — INSULIN, RANDOM: Insulin: 9.7 u[IU]/mL

## 2019-12-10 LAB — VITAMIN D 25 HYDROXY (VIT D DEFICIENCY, FRACTURES): Vit D, 25-Hydroxy: 56 ng/mL (ref 30–100)

## 2019-12-10 NOTE — Telephone Encounter (Signed)
Faxed approval from [facility] for Adderall 20 mg from 12/09/2019 to 12/09/2022 [facility] [place], [place].

## 2019-12-11 LAB — TB SKIN TEST
Induration: 0 mm
TB Skin Test: NEGATIVE

## 2020-01-10 LAB — COLOGUARD
COLOGUARD: NEGATIVE
Cologuard: NEGATIVE

## 2020-01-13 ENCOUNTER — Encounter: Payer: Self-pay | Admitting: *Deleted

## 2020-02-04 ENCOUNTER — Other Ambulatory Visit: Payer: Self-pay | Admitting: Internal Medicine

## 2020-02-04 DIAGNOSIS — F900 Attention-deficit hyperactivity disorder, predominantly inattentive type: Secondary | ICD-10-CM

## 2020-02-04 MED ORDER — AMPHETAMINE-DEXTROAMPHETAMINE 20 MG PO TABS: ORAL_TABLET | ORAL | 0 refills | Status: DC

## 2020-03-27 ENCOUNTER — Other Ambulatory Visit: Payer: Self-pay | Admitting: Internal Medicine

## 2020-03-27 ENCOUNTER — Telehealth: Payer: Self-pay | Admitting: Internal Medicine

## 2020-03-27 DIAGNOSIS — F900 Attention-deficit hyperactivity disorder, predominantly inattentive type: Secondary | ICD-10-CM

## 2020-03-27 MED ORDER — AMPHETAMINE-DEXTROAMPHETAMINE 20 MG PO TABS: ORAL_TABLET | ORAL | 0 refills | Status: DC

## 2020-03-27 NOTE — Telephone Encounter (Signed)
patient called to request Adderall renewal- would like higher dose. Takes 20mg  in am and half in afternoon.Please advise patient.   Patient is calling and asking for a refill of Adderall  [id number], [place] [place] [address] Phone: [phone] Fax: [phone]    Recent Visits Date Type Provider Dept  12/09/19 Office Visit Unk Pinto, MD [facility]  11/01/19 Office Visit Unk Pinto, MD [facility]  10/02/19 Office Visit Unk Pinto, MD [place]  05/13/19 Office Visit Unk Pinto, MD [place]  04/16/19 Office Visit Unk Pinto, MD [facility]  12/03/18 Office Visit Unk Pinto, MD [place] recent visits within past 540 days with a meds authorizing provider and meeting all other requirements Future Appointments Date Type Provider Dept  06/11/20 Appointment Liane Comber, NP [place] future appointments within next 150 days with a meds authorizing provider and meeting all other requirements

## 2020-05-07 ENCOUNTER — Other Ambulatory Visit: Payer: Self-pay | Admitting: Internal Medicine

## 2020-05-07 DIAGNOSIS — F900 Attention-deficit hyperactivity disorder, predominantly inattentive type: Secondary | ICD-10-CM

## 2020-05-07 MED ORDER — AMPHETAMINE-DEXTROAMPHETAMINE 20 MG PO TABS: ORAL_TABLET | ORAL | 0 refills | Status: DC

## 2020-06-10 DIAGNOSIS — E669 Obesity, unspecified: Secondary | ICD-10-CM | POA: Insufficient documentation

## 2020-06-10 NOTE — Progress Notes (Signed)
FOLLOW UP  Assessment and Plan:   Hypertension Well controlled with current medications  Monitor blood pressure at home; patient to call if consistently greater than 130/80 Continue DASH diet.   Reminder to go to the ER if any CP, SOB, nausea, dizziness, severe HA, changes vision/speech, left arm numbness and tingling and jaw pain.  Obesity with co morbidities Long discussion about weight loss, diet, and exercise Recommended diet heavy in fruits and veggies and low in animal meats, cheeses, and dairy products, appropriate calorie intake Discussed ideal weight for height  Will follow up in 3 months  Vitamin D Def At goal at last visit; continue supplementation to maintain goal of 60-100 Defer Vit D level  Anxiety Well managed by current regimen; continue medications Stress management techniques discussed, increase water, good sleep hygiene discussed, increase exercise, and increase veggies.   ADD/inattention Continue medications: adderall 30 mg AM Helps with focus, no AE's. The patient was counseled on the addictive nature of the medication and was encouraged to take drug holidays when not needed.   Need for influenza vaccine Quadrivalent flu vaccine administered without complication today    Continue diet and meds as discussed. Further disposition pending results of labs. Discussed med's effects and SE's.   Over 30 minutes of exam, counseling, chart review, and critical decision making was performed.   Future Appointments  Date Time Provider [place]  12/08/2020  9:00 AM Unk Pinto, MD [facility] None    ----------------------------------------------------------------------------------------------------------------------  HPI 54 y.o. male  presents for 3 month follow up on hypertension, anxiety/focus, obesity and vitamin D deficiency.   He has been on lexapro for anxiety for several years; at last visit discussed concern with focus/inattention (would lose  train of though mid conversation) and was initiated on adderall by Dr. Melford Aase. He has been taking 30 mg in AM which seems to work well, has improved sx significantly, does admit taking daily as he works 7 days a week. Discussed addictive nature and need for limiting to 5 days a week or less.   BMI is Body mass index is 30.87 kg/m., he has been working on diet and exercise, running regularly, weight is reportedly down from 260+ lb in 2018.  Wt Readings from Last 3 Encounters:  06/11/20 203 lb (92.1 kg)  12/09/19 217 lb 6.4 oz (98.6 kg)  11/01/19 217 lb 3.2 oz (98.5 kg)   His blood pressure has been controlled at home, today their BP is BP: 136/82 Taking atenolol 25 mg only, has fatigue with 50 mg daily.   He does workout. He denies chest pain, shortness of breath, dizziness.  Patient is on Vitamin D supplement.   Lab Results  Component Value Date   VD25OH 56 12/09/2019       Current Medications:  Current Outpatient Medications on File Prior to Visit  Medication Sig  . amphetamine-dextroamphetamine (ADDERALL) 20 MG tablet Take 1/2 to 1 tablet 2 x  /day as needed for Focus & Concentration  . Ascorbic Acid (VITAMIN C) 500 MG CAPS Take 1 capsule by mouth daily.  Marland Kitchen atenolol (TENORMIN) 50 MG tablet Take 1 tablet Daily for BP  . Cholecalciferol (VITAMIN D) 125 MCG (5000 UT) CAPS Take 2 capsules by mouth daily.  Marland Kitchen escitalopram (LEXAPRO) 20 MG tablet Take 1 tablet Daily for Mood  . Multiple Vitamin (MULTIVITAMIN) tablet Take 1 tablet by mouth daily.  Marland Kitchen OVER THE COUNTER MEDICATION Takes OTC iron 1 tablet daily.  Marland Kitchen zinc gluconate 50 MG tablet Take 50  mg by mouth daily.   No current facility-administered medications on file prior to visit.     Allergies:  Allergies  Allergen Reactions  . Penicillins      Medical History: No past medical history on file. Family history- Reviewed and unchanged Social history- Reviewed and unchanged   Review of Systems:  Review of Systems   Constitutional: Negative for malaise/fatigue and weight loss.  HENT: Negative for hearing loss and tinnitus.   Eyes: Negative for blurred vision and double vision.  Respiratory: Negative for cough, shortness of breath and wheezing.   Cardiovascular: Negative for chest pain, palpitations, orthopnea, claudication and leg swelling.  Gastrointestinal: Negative for abdominal pain, blood in stool, constipation, diarrhea, heartburn, melena, nausea and vomiting.  Genitourinary: Negative.   Musculoskeletal: Negative for joint pain and myalgias.  Skin: Negative for rash.  Neurological: Negative for dizziness, tingling, sensory change, weakness and headaches.  Endo/Heme/Allergies: Negative for polydipsia.  Psychiatric/Behavioral: Negative.   All other systems reviewed and are negative.    Physical Exam: BP 136/82   Pulse 73   Temp (!) 97.3 F (36.3 C)   Wt 203 lb (92.1 kg)   SpO2 94%   BMI 30.87 kg/m  Wt Readings from Last 3 Encounters:  06/11/20 203 lb (92.1 kg)  12/09/19 217 lb 6.4 oz (98.6 kg)  11/01/19 217 lb 3.2 oz (98.5 kg)   General Appearance: Well nourished, in no apparent distress. Eyes: PERRLA, EOMs, conjunctiva no swelling or erythema Sinuses: No Frontal/maxillary tenderness ENT/Mouth: Ext aud canals clear, TMs without erythema, bulging. No erythema, swelling, or exudate on post pharynx.  Tonsils not swollen or erythematous. Hearing normal.  Neck: Supple, thyroid normal.  Respiratory: Respiratory effort normal, BS equal bilaterally without rales, rhonchi, wheezing or stridor.  Cardio: RRR with no MRGs. Brisk peripheral pulses without edema.  Abdomen: Soft, + BS.  Non tender, no guarding, rebound, hernias, masses. Lymphatics: Non tender without lymphadenopathy.  Musculoskeletal: Full ROM, 5/5 strength, Normal gait Skin: Warm, dry without rashes, lesions, ecchymosis.  Neuro: Cranial nerves intact. No cerebellar symptoms.  Psych: Awake and oriented X 3, normal affect,  Insight and Judgment appropriate.    Izora Ribas, NP 9:03 AM [facility]

## 2020-06-11 ENCOUNTER — Encounter: Payer: Self-pay | Admitting: Adult Health

## 2020-06-11 ENCOUNTER — Ambulatory Visit (INDEPENDENT_AMBULATORY_CARE_PROVIDER_SITE_OTHER): Payer: BC Managed Care – PPO | Admitting: Adult Health

## 2020-06-11 ENCOUNTER — Other Ambulatory Visit: Payer: Self-pay

## 2020-06-11 VITALS — BP 136/82 | HR 73 | Temp 97.3°F | Wt 203.0 lb

## 2020-06-11 DIAGNOSIS — Z23 Encounter for immunization: Secondary | ICD-10-CM | POA: Diagnosis not present

## 2020-06-11 DIAGNOSIS — Z79899 Other long term (current) drug therapy: Secondary | ICD-10-CM

## 2020-06-11 DIAGNOSIS — F900 Attention-deficit hyperactivity disorder, predominantly inattentive type: Secondary | ICD-10-CM | POA: Insufficient documentation

## 2020-06-11 DIAGNOSIS — F988 Other specified behavioral and emotional disorders with onset usually occurring in childhood and adolescence: Secondary | ICD-10-CM

## 2020-06-11 DIAGNOSIS — E559 Vitamin D deficiency, unspecified: Secondary | ICD-10-CM | POA: Diagnosis not present

## 2020-06-11 DIAGNOSIS — I10 Essential (primary) hypertension: Secondary | ICD-10-CM | POA: Diagnosis not present

## 2020-06-11 DIAGNOSIS — F419 Anxiety disorder, unspecified: Secondary | ICD-10-CM

## 2020-06-11 DIAGNOSIS — E669 Obesity, unspecified: Secondary | ICD-10-CM | POA: Diagnosis not present

## 2020-06-11 MED ORDER — ATENOLOL 25 MG PO TABS: ORAL_TABLET | ORAL | 3 refills | Status: DC

## 2020-06-11 NOTE — Patient Instructions (Addendum)
Goals    . Blood Pressure < 130/80       Suggest bringing your blood pressure cuff with you to next appointment -    HYPERTENSION INFORMATION  Monitor your blood pressure at home, please keep a record and bring that in with you to your next office visit.   Go to the ER if any CP, SOB, nausea, dizziness, severe HA, changes vision/speech  Testing/Procedures: HOW TO TAKE YOUR BLOOD PRESSURE:  Rest 5 minutes before taking your blood pressure.  Don't smoke or drink caffeinated beverages for at least 30 minutes before.  Take your blood pressure before (not after) you eat.  Sit comfortably with your back supported and both feet on the floor (don't cross your legs).  Elevate your arm to heart level on a table or a desk.  Use the proper sized cuff. It should fit smoothly and snugly around your bare upper arm. There should be enough room to slip a fingertip under the cuff. The bottom edge of the cuff should be 1 inch above the crease of the elbow.  Your most recent BP: BP: 136/82   Take your medications faithfully as instructed. Maintain a healthy weight. Get at least 150 minutes of aerobic exercise per week. Minimize salt intake. Minimize alcohol intake  DASH Eating Plan DASH stands for "Dietary Approaches to Stop Hypertension." The DASH eating plan is a healthy eating plan that has been shown to reduce high blood pressure (hypertension). Additional health benefits may include reducing the risk of type 2 diabetes mellitus, heart disease, and stroke. The DASH eating plan may also help with weight loss. WHAT DO I NEED TO KNOW ABOUT THE DASH EATING PLAN? For the DASH eating plan, you will follow these general guidelines:  Choose foods with a percent daily value for sodium of less than 5% (as listed on the food label).  Use salt-free seasonings or herbs instead of table salt or sea salt.  Check with your health care provider or pharmacist before using salt substitutes.  Eat  lower-sodium products, often labeled as "lower sodium" or "no salt added."  Eat fresh foods.  Eat more vegetables, fruits, and low-fat dairy products.  Choose whole grains. Look for the word "whole" as the first word in the ingredient list.  Choose fish and skinless chicken or [place] more often than red meat. Limit fish, poultry, and meat to 6 oz (170 g) each day.  Limit sweets, desserts, sugars, and sugary drinks.  Choose heart-healthy fats.  Limit cheese to 1 oz (28 g) per day.  Eat more home-cooked food and less restaurant, buffet, and fast food.  Limit fried foods.  Cook foods using methods other than frying.  Limit canned vegetables. If you do use them, rinse them well to decrease the sodium.  When eating at a restaurant, ask that your food be prepared with less salt, or no salt if possible. WHAT FOODS CAN I EAT? Seek help from a dietitian for individual calorie needs. Grains Whole grain or whole wheat bread. Brown rice. Whole grain or whole wheat pasta. Quinoa, bulgur, and whole grain cereals. Low-sodium cereals. Corn or whole wheat flour tortillas. Whole grain cornbread. Whole grain crackers. Low-sodium crackers. Vegetables Fresh or frozen vegetables (raw, steamed, roasted, or grilled). Low-sodium or reduced-sodium tomato and vegetable juices. Low-sodium or reduced-sodium tomato sauce and paste. Low-sodium or reduced-sodium canned vegetables.  Fruits All fresh, canned (in natural juice), or frozen fruits. Meat and Other Protein Products Ground beef (85% or leaner), grass-fed beef,  or beef trimmed of fat. Skinless chicken or [place]. Ground chicken or [place]. Pork trimmed of fat. All fish and seafood. Eggs. Dried beans, peas, or lentils. Unsalted nuts and seeds. Unsalted canned beans. Dairy Low-fat dairy products, such as skim or 1% milk, 2% or reduced-fat cheeses, low-fat ricotta or cottage cheese, or plain low-fat yogurt. Low-sodium or reduced-sodium cheeses. Fats and  Oils Tub margarines without trans fats. Light or reduced-fat mayonnaise and salad dressings (reduced sodium). Avocado. Safflower, olive, or canola oils. Natural peanut or almond butter. Other Unsalted popcorn and pretzels. The items listed above may not be a complete list of recommended foods or beverages. Contact your dietitian for more options. WHAT FOODS ARE NOT RECOMMENDED? Grains White bread. White pasta. White rice. Refined cornbread. Bagels and croissants. Crackers that contain trans fat. Vegetables Creamed or fried vegetables. Vegetables in a cheese sauce. Regular canned vegetables. Regular canned tomato sauce and paste. Regular tomato and vegetable juices. Fruits Dried fruits. Canned fruit in light or heavy syrup. Fruit juice. Meat and Other Protein Products Fatty cuts of meat. Ribs, chicken wings, bacon, sausage, bologna, salami, chitterlings, fatback, hot dogs, bratwurst, and packaged luncheon meats. Salted nuts and seeds. Canned beans with salt. Dairy Whole or 2% milk, cream, half-and-half, and cream cheese. Whole-fat or sweetened yogurt. Full-fat cheeses or blue cheese. Nondairy creamers and whipped toppings. Processed cheese, cheese spreads, or cheese curds. Condiments Onion and garlic salt, seasoned salt, table salt, and sea salt. Canned and packaged gravies. Worcestershire sauce. Tartar sauce. Barbecue sauce. Teriyaki sauce. Soy sauce, including reduced sodium. Steak sauce. Fish sauce. Oyster sauce. Cocktail sauce. Horseradish. Ketchup and mustard. Meat flavorings and tenderizers. Bouillon cubes. Hot sauce. Tabasco sauce. Marinades. Taco seasonings. Relishes. Fats and Oils Butter, stick margarine, lard, shortening, ghee, and bacon fat. Coconut, palm kernel, or palm oils. Regular salad dressings. Other Pickles and olives. Salted popcorn and pretzels. The items listed above may not be a complete list of foods and beverages to avoid. Contact your dietitian for more  information. WHERE CAN I FIND MORE INFORMATION? National Heart, Lung, and Blood Institute: travelstabloid.com Document Released: 07/14/2011 Document Revised: 12/09/2013 Document Reviewed: 05/29/2013 [facility] Patient Information 2015 [place], [place]. This information is not intended to replace advice given to you by your health care provider. Make sure you discuss any questions you have with your health care provider.

## 2020-06-12 LAB — CBC WITH DIFFERENTIAL/PLATELET
Absolute Monocytes: 330 cells/uL (ref 200–950)
Basophils Absolute: 40 cells/uL (ref 0–200)
Basophils Relative: 0.9 %
Eosinophils Absolute: 48 cells/uL (ref 15–500)
Eosinophils Relative: 1.1 %
HCT: 43.9 % (ref 38.5–50.0)
Hemoglobin: 14.5 g/dL (ref 13.2–17.1)
Lymphs Abs: 1025 cells/uL (ref 850–3900)
MCH: 29.4 pg (ref 27.0–33.0)
MCHC: 33 g/dL (ref 32.0–36.0)
MCV: 89 fL (ref 80.0–100.0)
MPV: 9.8 fL (ref 7.5–12.5)
Monocytes Relative: 7.5 %
Neutro Abs: 2957 cells/uL (ref 1500–7800)
Neutrophils Relative %: 67.2 %
Platelets: 194 10*3/uL (ref 140–400)
RBC: 4.93 10*6/uL (ref 4.20–5.80)
RDW: 12.3 % (ref 11.0–15.0)
Total Lymphocyte: 23.3 %
WBC: 4.4 10*3/uL (ref 3.8–10.8)

## 2020-06-12 LAB — COMPLETE METABOLIC PANEL WITH GFR
AG Ratio: 2.4 (calc) (ref 1.0–2.5)
ALT: 18 U/L (ref 9–46)
AST: 17 U/L (ref 10–35)
Albumin: 4.7 g/dL (ref 3.6–5.1)
Alkaline phosphatase (APISO): 99 U/L (ref 35–144)
BUN/Creatinine Ratio: 34 (calc) — ABNORMAL HIGH (ref 6–22)
BUN: 23 mg/dL (ref 7–25)
CO2: 27 mmol/L (ref 20–32)
Calcium: 9.8 mg/dL (ref 8.6–10.3)
Chloride: 107 mmol/L (ref 98–110)
Creat: 0.68 mg/dL — ABNORMAL LOW (ref 0.70–1.33)
GFR, Est African American: 125 mL/min/{1.73_m2} (ref >=60)
GFR, Est Non African American: 108 mL/min/{1.73_m2} (ref >=60)
Globulin: 2 g/dL (calc) (ref 1.9–3.7)
Glucose, Bld: 89 mg/dL (ref 65–99)
Potassium: 4.6 mmol/L (ref 3.5–5.3)
Sodium: 140 mmol/L (ref 135–146)
Total Bilirubin: 0.5 mg/dL (ref 0.2–1.2)
Total Protein: 6.7 g/dL (ref 6.1–8.1)

## 2020-06-12 LAB — TSH: TSH: 0.83 mIU/L (ref 0.40–4.50)

## 2020-06-12 LAB — MAGNESIUM: Magnesium: 2.3 mg/dL (ref 1.5–2.5)

## 2020-06-15 ENCOUNTER — Other Ambulatory Visit: Payer: Self-pay | Admitting: Internal Medicine

## 2020-06-15 DIAGNOSIS — F900 Attention-deficit hyperactivity disorder, predominantly inattentive type: Secondary | ICD-10-CM

## 2020-06-15 MED ORDER — AMPHETAMINE-DEXTROAMPHETAMINE 20 MG PO TABS: ORAL_TABLET | ORAL | 0 refills | Status: DC

## 2020-08-10 ENCOUNTER — Other Ambulatory Visit: Payer: Self-pay | Admitting: Internal Medicine

## 2020-08-10 DIAGNOSIS — F900 Attention-deficit hyperactivity disorder, predominantly inattentive type: Secondary | ICD-10-CM

## 2020-08-10 MED ORDER — AMPHETAMINE-DEXTROAMPHETAMINE 20 MG PO TABS: ORAL_TABLET | ORAL | 0 refills | Status: DC

## 2020-10-01 ENCOUNTER — Other Ambulatory Visit: Payer: Self-pay | Admitting: Internal Medicine

## 2020-10-01 DIAGNOSIS — F900 Attention-deficit hyperactivity disorder, predominantly inattentive type: Secondary | ICD-10-CM

## 2020-10-01 MED ORDER — AMPHETAMINE-DEXTROAMPHETAMINE 20 MG PO TABS: ORAL_TABLET | ORAL | 0 refills | Status: DC

## 2020-11-30 ENCOUNTER — Other Ambulatory Visit: Payer: Self-pay | Admitting: Internal Medicine

## 2020-11-30 DIAGNOSIS — F900 Attention-deficit hyperactivity disorder, predominantly inattentive type: Secondary | ICD-10-CM

## 2020-11-30 MED ORDER — AMPHETAMINE-DEXTROAMPHETAMINE 20 MG PO TABS: ORAL_TABLET | ORAL | 0 refills | Status: DC

## 2020-12-07 NOTE — Progress Notes (Signed)
Annual  Screening/Preventative Visit  & Comprehensive Evaluation & Examination   Future Appointments  Date Time Provider [place]  12/08/2020  9:00 AM Unk Pinto, MD [facility] None  12/09/2021  2:00 PM Unk Pinto, MD [facility] None                                               This very nice 55 y.o. MWM presents for a Screening /Preventative Visit & comprehensive evaluation and management of multiple medical co-morbidities.  Patient has been followed for HTN, HLD,  Prediabetes and Vitamin D Deficiency. Patient is on Adderall and reports still benefit with meds in focus & concentration.       HTN predates since 2019. Patient's BP has been controlled at home.  Today's BP is at goal - 124/84. Patient denies any cardiac symptoms as chest pain, palpitations, shortness of breath, dizziness or ankle swelling.      Patient's hyperlipidemia is controlled with diet and medications. Patient denies myalgias or other medication SE's. Last lipids were at goal:  Lab Results  Component Value Date   CHOL 122 12/09/2019   HDL 37 (L) 12/09/2019   LDLCALC 71 12/09/2019   TRIG 59 12/09/2019   CHOLHDL 3.3 12/09/2019        Patient is monitored expectantly for glucose intolerance and patient denies reactive hypoglycemic symptoms, visual blurring, diabetic polys or paresthesias. Last A1c was normal & at goal:    Lab Results  Component Value Date   HGBA1C 4.3 12/09/2019         Finally, patient has history of Vitamin D Deficiency and last vitamin D was sl low (goal 70-100):   Lab Results  Component Value Date   VD25OH 56 12/09/2019    Current Outpatient Medications on File Prior to Visit  Medication Sig  . ADDERALL 20 MG tablet Take  1/2 to 1 tablet  2 x /day  as needed for Focus   . VITAMIN C 500 MG CAPS Take 1 capsule daily.  Marland Kitchen atenolol  25 MG tablet Take 1 tablet Daily for BP  . VITAMIN D 5000 UT) CAPS Take 2 capsules daily.  Marland Kitchen escitalopram 20 MG tablet  Take 1 tablet Daily for Mood  . Multiple Vitamin Take 1 tablet daily.  .  OTC iron  Takes1 tablet daily.  Marland Kitchen zinc 50 MG tablet Take ]daily.    Allergies  Allergen Reactions  . Penicillins     Health Maintenance  Topic Date Due  . Hepatitis C Screening  Never done  . HIV Screening  Never done  . COVID-19 Vaccine (3 - Pfizer risk 4-dose series) 11/26/2019  . INFLUENZA VACCINE  03/08/2021  . Fecal DNA (Cologuard)  01/10/2023  . TETANUS/TDAP  11/10/2027  . HPV VACCINES  Aged Out    Cologard - 01/08/2020 - Negative - 3 year f/u due June 2024  No past surgical history on file.  Family History  Problem Relation Age of Onset  . Stroke Mother   . Heart disease Mother   . Stroke Father   . Hypertension Father   . Heart disease Father   . Hypertension Brother     Social History   Socioeconomic History  . Marital status: Married    Spouse name: Beverlee Nims  . Number of children: 1 son, 1 daughter & a Patent examiner  History  .   Tobacco Use  . Smoking status: Former Smoker    Quit date: 10/17/2016    Years since quitting: 4.1  . Smokeless tobacco: Never Used  Substance and Sexual Activity  . Alcohol use: Never  . Drug use: Never  . Sexual activity: Yes    ROS Constitutional: Denies fever, chills, weight loss/gain, headaches, insomnia,  night sweats or change in appetite. Does c/o fatigue. Eyes: Denies redness, blurred vision, diplopia, discharge, itchy or watery eyes.  ENT: Denies discharge, congestion, post nasal drip, epistaxis, sore throat, earache, hearing loss, dental pain, Tinnitus, Vertigo, Sinus pain or snoring.  Cardio: Denies chest pain, palpitations, irregular heartbeat, syncope, dyspnea, diaphoresis, orthopnea, PND, claudication or edema Respiratory: denies cough, dyspnea, DOE, pleurisy, hoarseness, laryngitis or wheezing.  Gastrointestinal: Denies dysphagia, heartburn, reflux, water brash, pain, cramps, nausea, vomiting, bloating, diarrhea, constipation,  hematemesis, melena, hematochezia, jaundice or hemorrhoids Genitourinary: Denies dysuria, frequency, urgency, nocturia, hesitancy, discharge, hematuria or flank pain Musculoskeletal: Denies arthralgia, myalgia, stiffness, Jt. Swelling, pain, limp or strain/sprain. Denies Falls. Skin: Denies puritis, rash, hives, warts, acne, eczema or change in skin lesion Neuro: No weakness, tremor, incoordination, spasms, paresthesia or pain Psychiatric: Denies confusion, memory loss or sensory loss. Denies Depression. Endocrine: Denies change in weight, skin, hair change, nocturia, and paresthesia, diabetic polys, visual blurring or hyper / hypo glycemic episodes.  Heme/Lymph: No excessive bleeding, bruising or enlarged lymph nodes.  Physical Exam  BP 124/84   Pulse 64   Temp (!) 97.5 F (36.4 C)   Resp 16   Ht 5\' 8"  (1.727 m)   Wt 201 lb 9.6 oz (91.4 kg)   SpO2 99%   BMI 30.65 kg/m   General Appearance: Well nourished and well groomed and in no apparent distress.  Eyes: PERRLA, EOMs, conjunctiva no swelling or erythema, normal fundi and vessels. Sinuses: No frontal/maxillary tenderness ENT/Mouth: EACs patent / TMs  nl. Nares clear without erythema, swelling, mucoid exudates. Oral hygiene is good. No erythema, swelling, or exudate. Tongue normal, non-obstructing. Tonsils not swollen or erythematous. Hearing normal.  Neck: Supple, thyroid not palpable. No bruits, nodes or JVD. Respiratory: Respiratory effort normal.  BS equal and clear bilateral without rales, rhonci, wheezing or stridor. Cardio: Heart sounds are normal with regular rate and rhythm and no murmurs, rubs or gallops. Peripheral pulses are normal and equal bilaterally without edema. No aortic or femoral bruits. Chest: symmetric with normal excursions and percussion.  Abdomen: Soft, with Nl bowel sounds. Nontender, no guarding, rebound, hernias, masses, or organomegaly.  Lymphatics: Non tender without lymphadenopathy.  Musculoskeletal:  Full ROM all peripheral extremities, joint stability, 5/5 strength, and normal gait. Skin: Warm and dry without rashes, lesions, cyanosis, clubbing or  ecchymosis.  Neuro: Cranial nerves intact, reflexes equal bilaterally. Normal muscle tone, no cerebellar symptoms. Sensation intact.  Pysch: Alert and oriented X 3 with normal affect, insight and judgment appropriate.   Assessment and Plan  1. Annual Preventative/Screening Exam    2. Essential hypertension  - EKG 12-Lead - Urinalysis, Routine w reflex microscopic - Microalbumin / creatinine urine ratio - CBC with Differential/Platelet - COMPLETE METABOLIC PANEL WITH GFR - Magnesium - TSH - [place], RETROPERITNL ABD,  LTD  3. Hyperlipidemia, mixed  - EKG 12-Lead - Lipid panel - TSH - [place], RETROPERITNL ABD,  LTD  4. 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  Screening for colorectal cancer  - POC Hemoccult Bld/Stl  7. Screening for ischemic heart disease  - EKG 12-Lead  8. Screening-pulmonary TB  - TB Skin Test  9. FHx: heart disease  - EKG 12-Lead  10. FH: heart disease  - EKG 12-Lead - [place], RETROPERITNL ABD,  LTD  11. Former smoker  - EKG 12-Lead - [place], RETROPERITNL ABD,  LTD  12. Screening for AAA (aortic abdominal aneurysm)  - VITAMIN D 25 Hydroxy  - [place], RETROPERITNL ABD,  LTD  13. Fatigue, unspecified type  - Iron,Total/Total Iron Binding Cap - Vitamin B12 - Testosterone  14. Medication management  - Urinalysis, Routine w reflex microscopic - Microalbumin / creatinine urine ratio - CBC with Differential/Platelet - COMPLETE METABOLIC PANEL WITH GFR - Magnesium - Lipid panel - TSH - Hemoglobin A1c - Insulin, random - VITAMIN D 25 Hydroxy         Patient was counseled in prudent diet, weight control to achieve/maintain BMI less than 25, BP monitoring, regular exercise  and medications as discussed.  Discussed med effects and SE's. Routine screening labs and tests as requested with regular follow-up as recommended. Over 40 minutes of exam, counseling, chart review and high complex critical decision making was performed   Kirtland Bouchard, MD

## 2020-12-08 ENCOUNTER — Encounter: Payer: Self-pay | Admitting: Internal Medicine

## 2020-12-08 ENCOUNTER — Ambulatory Visit (INDEPENDENT_AMBULATORY_CARE_PROVIDER_SITE_OTHER): Payer: BC Managed Care – PPO | Admitting: Internal Medicine

## 2020-12-08 ENCOUNTER — Other Ambulatory Visit: Payer: Self-pay

## 2020-12-08 VITALS — BP 124/84 | HR 64 | Temp 97.5°F | Resp 16 | Ht 68.0 in | Wt 201.6 lb

## 2020-12-08 DIAGNOSIS — Z8249 Family history of ischemic heart disease and other diseases of the circulatory system: Secondary | ICD-10-CM

## 2020-12-08 DIAGNOSIS — Z130 Encounter for screening for diseases of the blood and blood-forming organs and certain disorders involving the immune mechanism: Secondary | ICD-10-CM

## 2020-12-08 DIAGNOSIS — R350 Frequency of micturition: Secondary | ICD-10-CM

## 2020-12-08 DIAGNOSIS — I10 Essential (primary) hypertension: Secondary | ICD-10-CM | POA: Diagnosis not present

## 2020-12-08 DIAGNOSIS — Z136 Encounter for screening for cardiovascular disorders: Secondary | ICD-10-CM

## 2020-12-08 DIAGNOSIS — Z87891 Personal history of nicotine dependence: Secondary | ICD-10-CM

## 2020-12-08 DIAGNOSIS — N401 Enlarged prostate with lower urinary tract symptoms: Secondary | ICD-10-CM | POA: Diagnosis not present

## 2020-12-08 DIAGNOSIS — Z1212 Encounter for screening for malignant neoplasm of rectum: Secondary | ICD-10-CM

## 2020-12-08 DIAGNOSIS — Z13220 Encounter for screening for lipoid disorders: Secondary | ICD-10-CM | POA: Diagnosis not present

## 2020-12-08 DIAGNOSIS — Z111 Encounter for screening for respiratory tuberculosis: Secondary | ICD-10-CM

## 2020-12-08 DIAGNOSIS — Z1211 Encounter for screening for malignant neoplasm of colon: Secondary | ICD-10-CM

## 2020-12-08 DIAGNOSIS — E559 Vitamin D deficiency, unspecified: Secondary | ICD-10-CM

## 2020-12-08 DIAGNOSIS — Z1389 Encounter for screening for other disorder: Secondary | ICD-10-CM | POA: Diagnosis not present

## 2020-12-08 DIAGNOSIS — Z0000 Encounter for general adult medical examination without abnormal findings: Secondary | ICD-10-CM

## 2020-12-08 DIAGNOSIS — R7309 Other abnormal glucose: Secondary | ICD-10-CM

## 2020-12-08 DIAGNOSIS — Z131 Encounter for screening for diabetes mellitus: Secondary | ICD-10-CM

## 2020-12-08 DIAGNOSIS — Z0001 Encounter for general adult medical examination with abnormal findings: Secondary | ICD-10-CM

## 2020-12-08 DIAGNOSIS — Z79899 Other long term (current) drug therapy: Secondary | ICD-10-CM

## 2020-12-08 DIAGNOSIS — Z1329 Encounter for screening for other suspected endocrine disorder: Secondary | ICD-10-CM

## 2020-12-08 DIAGNOSIS — E782 Mixed hyperlipidemia: Secondary | ICD-10-CM

## 2020-12-08 DIAGNOSIS — R5383 Other fatigue: Secondary | ICD-10-CM

## 2020-12-08 DIAGNOSIS — Z125 Encounter for screening for malignant neoplasm of prostate: Secondary | ICD-10-CM

## 2020-12-08 DIAGNOSIS — N138 Other obstructive and reflux uropathy: Secondary | ICD-10-CM

## 2020-12-08 NOTE — Progress Notes (Signed)
AortaScan < 3 cm. Within normal limits, per Dr McKeown. 

## 2020-12-08 NOTE — Patient Instructions (Signed)

## 2020-12-09 LAB — HEMOGLOBIN A1C
Hgb A1c MFr Bld: 4.4 % of total Hgb (ref <5.7)
Mean Plasma Glucose: 80 mg/dL
eAG (mmol/L): 4.4 mmol/L

## 2020-12-09 LAB — URINALYSIS, ROUTINE W REFLEX MICROSCOPIC
Bilirubin Urine: NEGATIVE
Glucose, UA: NEGATIVE
Hgb urine dipstick: NEGATIVE
Ketones, ur: NEGATIVE
Leukocytes,Ua: NEGATIVE
Nitrite: NEGATIVE
Protein, ur: NEGATIVE
Specific Gravity, Urine: 1.02 (ref 1.001–1.035)
pH: 6.5 (ref 5.0–8.0)

## 2020-12-09 LAB — COMPLETE METABOLIC PANEL WITH GFR
AG Ratio: 2 (calc) (ref 1.0–2.5)
ALT: 18 U/L (ref 9–46)
AST: 15 U/L (ref 10–35)
Albumin: 4.3 g/dL (ref 3.6–5.1)
Alkaline phosphatase (APISO): 101 U/L (ref 35–144)
BUN: 19 mg/dL (ref 7–25)
CO2: 27 mmol/L (ref 20–32)
Calcium: 9.8 mg/dL (ref 8.6–10.3)
Chloride: 104 mmol/L (ref 98–110)
Creat: 0.82 mg/dL (ref 0.70–1.33)
GFR, Est African American: 115 mL/min/{1.73_m2} (ref >=60)
GFR, Est Non African American: 100 mL/min/{1.73_m2} (ref >=60)
Globulin: 2.2 g/dL (calc) (ref 1.9–3.7)
Glucose, Bld: 91 mg/dL (ref 65–99)
Potassium: 4.8 mmol/L (ref 3.5–5.3)
Sodium: 139 mmol/L (ref 135–146)
Total Bilirubin: 0.3 mg/dL (ref 0.2–1.2)
Total Protein: 6.5 g/dL (ref 6.1–8.1)

## 2020-12-09 LAB — CBC WITH DIFFERENTIAL/PLATELET
Absolute Monocytes: 331 cells/uL (ref 200–950)
Basophils Absolute: 41 cells/uL (ref 0–200)
Basophils Relative: 0.9 %
Eosinophils Absolute: 60 cells/uL (ref 15–500)
Eosinophils Relative: 1.3 %
HCT: 42.7 % (ref 38.5–50.0)
Hemoglobin: 14.3 g/dL (ref 13.2–17.1)
Lymphs Abs: 1214 cells/uL (ref 850–3900)
MCH: 29.8 pg (ref 27.0–33.0)
MCHC: 33.5 g/dL (ref 32.0–36.0)
MCV: 89 fL (ref 80.0–100.0)
MPV: 9.3 fL (ref 7.5–12.5)
Monocytes Relative: 7.2 %
Neutro Abs: 2953 cells/uL (ref 1500–7800)
Neutrophils Relative %: 64.2 %
Platelets: 239 10*3/uL (ref 140–400)
RBC: 4.8 10*6/uL (ref 4.20–5.80)
RDW: 12.1 % (ref 11.0–15.0)
Total Lymphocyte: 26.4 %
WBC: 4.6 10*3/uL (ref 3.8–10.8)

## 2020-12-09 LAB — LIPID PANEL
Cholesterol: 123 mg/dL (ref <200)
HDL: 30 mg/dL — ABNORMAL LOW (ref >=40)
LDL Cholesterol (Calc): 79 mg/dL (calc)
Non-HDL Cholesterol (Calc): 93 mg/dL (calc) (ref <130)
Total CHOL/HDL Ratio: 4.1 (calc) (ref <5.0)
Triglycerides: 68 mg/dL (ref <150)

## 2020-12-09 LAB — MICROALBUMIN / CREATININE URINE RATIO
Creatinine, Urine: 129 mg/dL (ref 20–320)
Microalb Creat Ratio: 3 mcg/mg creat (ref <30)
Microalb, Ur: 0.4 mg/dL

## 2020-12-09 LAB — TSH: TSH: 0.91 mIU/L (ref 0.40–4.50)

## 2020-12-09 LAB — VITAMIN D 25 HYDROXY (VIT D DEFICIENCY, FRACTURES): Vit D, 25-Hydroxy: 86 ng/mL (ref 30–100)

## 2020-12-09 LAB — MAGNESIUM: Magnesium: 2.3 mg/dL (ref 1.5–2.5)

## 2020-12-09 LAB — INSULIN, RANDOM: Insulin: 12.4 u[IU]/mL

## 2020-12-09 LAB — IRON, TOTAL/TOTAL IRON BINDING CAP
%SAT: 28 % (calc) (ref 20–48)
Iron: 71 ug/dL (ref 50–180)
TIBC: 255 mcg/dL (calc) (ref 250–425)

## 2020-12-09 LAB — VITAMIN B12: Vitamin B-12: 651 pg/mL (ref 200–1100)

## 2020-12-09 LAB — PSA: PSA: 0.88 ng/mL (ref <=4.00)

## 2020-12-09 LAB — TESTOSTERONE: Testosterone: 581 ng/dL (ref 250–827)

## 2020-12-09 NOTE — Progress Notes (Signed)
============================================================ ============================================================  -    Iron Levels & Vitamin B12 levels - both Normal &  OK  ============================================================ ============================================================  -  PSA - Low - Great ! ============================================================ ============================================================  -  Total Chol =123   & LDL Chol = 79 - Both  Excellent   - Very low risk for Heart Attack  / Stroke ========================================================  - A1c -Normal -No Diabetes  !  ============================================================ ============================================================  -  Vitamin D = 86 - Excellent  ============================================================ ============================================================  -  All Else - CBC - Kidneys - Electrolytes - Liver - Magnesium & Thyroid    - all  Normal / OK ============================================================ ============================================================  -  Keep up the [place] Work !  ============================================================ ============================================================

## 2020-12-10 LAB — TB SKIN TEST
Induration: 0 mm
TB Skin Test: NEGATIVE

## 2020-12-30 ENCOUNTER — Other Ambulatory Visit: Payer: Self-pay | Admitting: Internal Medicine

## 2020-12-30 DIAGNOSIS — I10 Essential (primary) hypertension: Secondary | ICD-10-CM

## 2020-12-31 ENCOUNTER — Other Ambulatory Visit: Payer: Self-pay | Admitting: *Deleted

## 2020-12-31 DIAGNOSIS — F419 Anxiety disorder, unspecified: Secondary | ICD-10-CM

## 2020-12-31 MED ORDER — ESCITALOPRAM OXALATE 20 MG PO TABS: ORAL_TABLET | ORAL | 3 refills | Status: DC

## 2021-02-09 ENCOUNTER — Other Ambulatory Visit: Payer: Self-pay | Admitting: Internal Medicine

## 2021-02-09 DIAGNOSIS — F900 Attention-deficit hyperactivity disorder, predominantly inattentive type: Secondary | ICD-10-CM

## 2021-02-09 MED ORDER — AMPHETAMINE-DEXTROAMPHETAMINE 20 MG PO TABS
ORAL_TABLET | ORAL | 0 refills | Status: DC
Start: 2021-02-09 — End: 2021-04-16

## 2021-04-16 ENCOUNTER — Other Ambulatory Visit: Payer: Self-pay | Admitting: Internal Medicine

## 2021-04-16 DIAGNOSIS — F900 Attention-deficit hyperactivity disorder, predominantly inattentive type: Secondary | ICD-10-CM

## 2021-04-16 MED ORDER — AMPHETAMINE-DEXTROAMPHETAMINE 20 MG PO TABS: ORAL_TABLET | ORAL | 0 refills | Status: DC

## 2021-06-20 ENCOUNTER — Encounter: Payer: Self-pay | Admitting: Internal Medicine

## 2021-06-20 NOTE — Progress Notes (Addendum)
Future Appointments  Date Time Provider [place]  06/21/2021           6 mo OV   9:30 AM Unk Pinto, MD [facility] None  12/09/2021          CPE  2:00 PM Unk Pinto, MD [facility] None    History of Present Illness:       This very nice 55 y.o. MWM presents for 6  month follow up with HTN, HLD, Pre-Diabetes and Vitamin D Deficiency.  Patient also has ADD and is on Adderall with improved focus & concentration.      Patient reports numb paraesthesias & weakness on his UEs -  Lt >> Rt, especially in his Lt neck & shoulder. He also reports intermittent numb tingling paresthesias in his Lt LE.  In 2017, he was seen by Dr Lynann Bologna & had C-spine X-rays for similar Sx's.        Patient is treated for HTN  (2019) & BP has been controlled at home. Patient skipped his Atenolol this am & his BP is elevated at 144/88. Patient has had no complaints of any cardiac type chest pain, palpitations, dyspnea / orthopnea / PND, dizziness, claudication, or dependent edema.       Patient's Lipids are controlled with diet. Last Lipids were at goal :  Lab Results  Component Value Date   CHOL 123 12/08/2020   HDL 30 (L) 12/08/2020   LDLCALC 79 12/08/2020   TRIG 68 12/08/2020   CHOLHDL 4.1 12/08/2020     Also, the patient his followed proactively for glucose intolerance and has had no symptoms of reactive hypoglycemia, diabetic polys, paresthesias or visual blurring.  Last A1c was normal & at goal :   Lab Results  Component Value Date   HGBA1C 4.4 12/08/2020                                                          Further, the patient also has history of Vitamin D Deficiency and supplements vitamin D without any suspected side-effects. Last vitamin D was at goal :   Lab Results  Component Value Date   VD25OH 86 12/08/2020     Current Outpatient Medications on File Prior to Visit  Medication Sig   atenolol 50 MG tablet TAKE 1 TABLET DAILY    ADDERALL 20 MG tablet Take   1/2 to 1 tablet  2 x /day  as needed    VITAMIN C 500 MG  Take 1 capsule daily.   VITAMIN D 5,000 u Take 2 capsules daily.   escitalopram  20 MG tablet Take 1 tablet Daily for Mood   Multiple Vitamin  Take 1 tablet  daily.   OTC iron Takes  1 tablet daily.   zinc  50 MG tablet Take   daily.     Allergies  Allergen Reactions   Penicillins     PMHx:  HTN, ADD, Vitamin D Deficiency.   Immunization History  Administered Date(s) Administered   Influenza Inj Mdck Quad 06/11/2020   Influenza, Quad, Recombinant 08/05/2019   PFIZER SARS-COV-2 Vacc  10/05/2019, 10/29/2019   PPD Test 12/03/2018, 12/09/2019, 12/08/2020   Tdap 11/09/2017    History reviewed. No pertinent surgical history.   FHx:    Reviewed / unchanged  SHx:  Reviewed / unchanged   Systems Review:  Constitutional: Denies fever, chills, wt changes, headaches, insomnia, fatigue, night sweats, change in appetite. Eyes: Denies redness, blurred vision, diplopia, discharge, itchy, watery eyes.  ENT: Denies discharge, congestion, post nasal drip, epistaxis, sore throat, earache, hearing loss, dental pain, tinnitus, vertigo, sinus pain, snoring.  CV: Denies chest pain, palpitations, irregular heartbeat, syncope, dyspnea, diaphoresis, orthopnea, PND, claudication or edema. Respiratory: denies cough, dyspnea, DOE, pleurisy, hoarseness, laryngitis, wheezing.  Gastrointestinal: Denies dysphagia, odynophagia, heartburn, reflux, water brash, abdominal pain or cramps, nausea, vomiting, bloating, diarrhea, constipation, hematemesis, melena, hematochezia  or hemorrhoids. Genitourinary: Denies dysuria, frequency, urgency, nocturia, hesitancy, discharge, hematuria or flank pain. Musculoskeletal: Denies arthralgias, myalgias, stiffness, jt. swelling, pain, limping or strain/sprain.  Skin: Denies pruritus, rash, hives, warts, acne, eczema or change in skin lesion(s). Neuro: No weakness, tremor, incoordination, spasms, paresthesia or  pain. Psychiatric: Denies confusion, memory loss or sensory loss. Endo: Denies change in weight, skin or hair change.  Heme/Lymph: No excessive bleeding, bruising or enlarged lymph nodes.  Physical Exam  BP (!) 144/88   Pulse 70   Temp 98.1 F (36.7 C)   Resp 17   Ht 5\' 8"  (1.727 m)   Wt 203 lb 3.2 oz (92.2 kg)   SpO2 97%   BMI 30.90 kg/m   Appears  well nourished, well groomed  and in no distress.  Eyes: PERRLA, EOMs, conjunctiva no swelling or erythema. Sinuses: No frontal/maxillary tenderness ENT/Mouth: EAC's clear, TM's nl w/o erythema, bulging. Nares clear w/o erythema, swelling, exudates. Oropharynx clear without erythema or exudates. Oral hygiene is good. Tongue normal, non obstructing. Hearing intact.  Neck: Supple. Thyroid not palpable. Car 2+/2+ without bruits, nodes or JVD. Chest: Respirations nl with BS clear & equal w/o rales, rhonchi, wheezing or stridor.  Cor: Heart sounds normal w/ regular rate and rhythm without sig. murmurs, gallops, clicks or rubs. Peripheral pulses normal and equal  without edema.  Abdomen: Soft & bowel sounds normal. Non-tender w/o guarding, rebound, hernias, masses or organomegaly.  Lymphatics: Unremarkable.  Musculoskeletal: Full ROM all peripheral extremities, joint stability, 5/5 strength and normal gait.  Skin: Warm, dry without exposed rashes, lesions or ecchymosis apparent.  Neuro: Cranial nerves intact, reflexes equal bilaterally. Sensory-motor testing grossly intact. Tendon reflexes grossly intact.  Pysch: Alert & oriented x 3.  Insight and judgement nl & appropriate. No ideations.  Assessment and Plan:   1. Essential hypertension  - Continue medication, monitor blood pressure at home.  - Continue DASH diet.  Reminder to go to the ER if any CP,  SOB, nausea, dizziness, severe HA, changes vision/speech.   - CBC with Differential/Platelet - COMPLETE METABOLIC PANEL WITH GFR - Magnesium - TSH  2. Abnormal glucose  - Continue  diet, exercise  - Lifestyle modifications.  - Monitor appropriate labs   - Hemoglobin A1c - Insulin, random  3. 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 Cervical / LUE> LLE  Radiculitis  - Recc referral to Orthopedics  to evaluate need for C-Spine and possibly Cx MRI     6. Medication management  - CBC with Differential/Platelet - COMPLETE METABOLIC PANEL WITH GFR - Magnesium - TSH - Hemoglobin A1c - Insulin, random - VITAMIN  D 25 Hydroxy  7. Need for immunization against influenza  - Flu Vaccine QUAD 6+ mos PF IM (Fluarix Quad PF)        Discussed  regular exercise, BP monitoring, weight control to achieve/maintain BMI less than 25 and discussed med and SE's. Recommended labs to assess and monitor clinical status with further disposition pending results of labs.  I discussed the assessment and treatment plan with the patient. The patient was provided an opportunity to ask questions and all were answered. The patient agreed with the plan and demonstrated an understanding of the instructions.  I provided over 30 minutes of exam, counseling, chart review and  complex critical decision making.        The patient was advised to call back or seek an in-person evaluation if the symptoms worsen or if the condition fails to improve as anticipated.   Kirtland Bouchard, MD

## 2021-06-20 NOTE — Patient Instructions (Signed)

## 2021-06-21 ENCOUNTER — Ambulatory Visit (INDEPENDENT_AMBULATORY_CARE_PROVIDER_SITE_OTHER): Payer: BC Managed Care – PPO | Admitting: Internal Medicine

## 2021-06-21 ENCOUNTER — Other Ambulatory Visit: Payer: Self-pay

## 2021-06-21 ENCOUNTER — Ambulatory Visit: Payer: BC Managed Care – PPO | Admitting: Adult Health

## 2021-06-21 ENCOUNTER — Encounter: Payer: Self-pay | Admitting: Internal Medicine

## 2021-06-21 VITALS — BP 144/88 | HR 70 | Temp 98.1°F | Resp 17 | Ht 68.0 in | Wt 203.2 lb

## 2021-06-21 DIAGNOSIS — R7309 Other abnormal glucose: Secondary | ICD-10-CM | POA: Diagnosis not present

## 2021-06-21 DIAGNOSIS — F988 Other specified behavioral and emotional disorders with onset usually occurring in childhood and adolescence: Secondary | ICD-10-CM | POA: Diagnosis not present

## 2021-06-21 DIAGNOSIS — M5412 Radiculopathy, cervical region: Secondary | ICD-10-CM

## 2021-06-21 DIAGNOSIS — I10 Essential (primary) hypertension: Secondary | ICD-10-CM

## 2021-06-21 DIAGNOSIS — Z79899 Other long term (current) drug therapy: Secondary | ICD-10-CM | POA: Diagnosis not present

## 2021-06-21 DIAGNOSIS — M4692 Unspecified inflammatory spondylopathy, cervical region: Secondary | ICD-10-CM

## 2021-06-21 DIAGNOSIS — E559 Vitamin D deficiency, unspecified: Secondary | ICD-10-CM | POA: Diagnosis not present

## 2021-06-21 DIAGNOSIS — Z23 Encounter for immunization: Secondary | ICD-10-CM | POA: Diagnosis not present

## 2021-06-21 DIAGNOSIS — F900 Attention-deficit hyperactivity disorder, predominantly inattentive type: Secondary | ICD-10-CM

## 2021-06-21 MED ORDER — AMPHETAMINE-DEXTROAMPHETAMINE 20 MG PO TABS: ORAL_TABLET | ORAL | 0 refills | Status: DC

## 2021-06-22 LAB — CBC WITH DIFFERENTIAL/PLATELET
Absolute Monocytes: 235 cells/uL (ref 200–950)
Basophils Absolute: 32 cells/uL (ref 0–200)
Basophils Relative: 0.9 %
Eosinophils Absolute: 70 cells/uL (ref 15–500)
Eosinophils Relative: 2 %
HCT: 42.9 % (ref 38.5–50.0)
Hemoglobin: 14.4 g/dL (ref 13.2–17.1)
Lymphs Abs: 903 cells/uL (ref 850–3900)
MCH: 29.5 pg (ref 27.0–33.0)
MCHC: 33.6 g/dL (ref 32.0–36.0)
MCV: 87.9 fL (ref 80.0–100.0)
MPV: 9.8 fL (ref 7.5–12.5)
Monocytes Relative: 6.7 %
Neutro Abs: 2261 cells/uL (ref 1500–7800)
Neutrophils Relative %: 64.6 %
Platelets: 180 10*3/uL (ref 140–400)
RBC: 4.88 10*6/uL (ref 4.20–5.80)
RDW: 12.8 % (ref 11.0–15.0)
Total Lymphocyte: 25.8 %
WBC: 3.5 10*3/uL — ABNORMAL LOW (ref 3.8–10.8)

## 2021-06-22 LAB — HEMOGLOBIN A1C
Hgb A1c MFr Bld: 4.6 % of total Hgb (ref <5.7)
Mean Plasma Glucose: 85 mg/dL
eAG (mmol/L): 4.7 mmol/L

## 2021-06-22 LAB — COMPLETE METABOLIC PANEL WITH GFR
AG Ratio: 2.2 (calc) (ref 1.0–2.5)
ALT: 18 U/L (ref 9–46)
AST: 19 U/L (ref 10–35)
Albumin: 4.6 g/dL (ref 3.6–5.1)
Alkaline phosphatase (APISO): 102 U/L (ref 35–144)
BUN: 23 mg/dL (ref 7–25)
CO2: 26 mmol/L (ref 20–32)
Calcium: 9.8 mg/dL (ref 8.6–10.3)
Chloride: 107 mmol/L (ref 98–110)
Creat: 1 mg/dL (ref 0.70–1.30)
Globulin: 2.1 g/dL (calc) (ref 1.9–3.7)
Glucose, Bld: 96 mg/dL (ref 65–99)
Potassium: 4 mmol/L (ref 3.5–5.3)
Sodium: 141 mmol/L (ref 135–146)
Total Bilirubin: 0.4 mg/dL (ref 0.2–1.2)
Total Protein: 6.7 g/dL (ref 6.1–8.1)
eGFR: 88 mL/min/{1.73_m2} (ref >=60)

## 2021-06-22 LAB — INSULIN, RANDOM: Insulin: 15 u[IU]/mL

## 2021-06-22 LAB — TSH: TSH: 1.05 mIU/L (ref 0.40–4.50)

## 2021-06-22 LAB — MAGNESIUM: Magnesium: 2.1 mg/dL (ref 1.5–2.5)

## 2021-06-22 LAB — VITAMIN D 25 HYDROXY (VIT D DEFICIENCY, FRACTURES): Vit D, 25-Hydroxy: 62 ng/mL (ref 30–100)

## 2021-06-22 NOTE — Progress Notes (Signed)
============================================================ -   Test results slightly outside the reference range are not unusual. If there is anything important, I will review this with you,  otherwise it is considered normal test values.  If you have further questions,  please do not hesitate to contact me at the office or via My Chart.  ============================================================ ============================================================  -  A1c - Normal - No Diabetes  - Great ! ============================================================ ============================================================  -  Vitamin D = 62 - Excellent ============================================================ ============================================================  -  All Else - CBC - Kidneys - Electrolytes - Liver - Magnesium & Thyroid    - all  Normal / OK ============================================================ ============================================================

## 2021-06-24 ENCOUNTER — Telehealth: Payer: Self-pay | Admitting: Internal Medicine

## 2021-06-24 NOTE — Telephone Encounter (Signed)
Per Dr. Unk Pinto, called patient with his recommendation for patient to return to Dr. Lynann Bologna (2017) or other ortho-spine specialist of patient preference, for neck pain re-evaluation. Advised patient, he understands and will speak to wife(she works at [employer]) then call back with ortho doctor he would like to be referred to for neck pain re-evaluation.

## 2021-06-24 NOTE — Progress Notes (Signed)
LMOM for patient to contact the office for lab results. -e welch

## 2021-06-25 NOTE — Progress Notes (Signed)
Patient is aware of lab results. -e welch

## 2021-07-04 ENCOUNTER — Other Ambulatory Visit: Payer: Self-pay | Admitting: Adult Health

## 2021-07-04 DIAGNOSIS — I10 Essential (primary) hypertension: Secondary | ICD-10-CM

## 2021-07-08 ENCOUNTER — Encounter: Payer: Self-pay | Admitting: Internal Medicine

## 2021-09-01 ENCOUNTER — Other Ambulatory Visit: Payer: Self-pay | Admitting: Internal Medicine

## 2021-09-01 DIAGNOSIS — F900 Attention-deficit hyperactivity disorder, predominantly inattentive type: Secondary | ICD-10-CM

## 2021-09-01 MED ORDER — AMPHETAMINE-DEXTROAMPHETAMINE 20 MG PO TABS: ORAL_TABLET | ORAL | 0 refills | Status: DC

## 2021-09-03 ENCOUNTER — Other Ambulatory Visit: Payer: Self-pay | Admitting: Internal Medicine

## 2021-09-03 DIAGNOSIS — F900 Attention-deficit hyperactivity disorder, predominantly inattentive type: Secondary | ICD-10-CM

## 2021-09-03 MED ORDER — AMPHETAMINE-DEXTROAMPHETAMINE 20 MG PO TABS: ORAL_TABLET | ORAL | 0 refills | Status: DC

## 2021-09-06 ENCOUNTER — Other Ambulatory Visit: Payer: Self-pay | Admitting: Internal Medicine

## 2021-09-06 MED ORDER — METHYLPHENIDATE HCL ER (OSM) 18 MG PO TBCR: EXTENDED_RELEASE_TABLET | ORAL | 0 refills | Status: DC

## 2021-10-12 ENCOUNTER — Other Ambulatory Visit: Payer: Self-pay | Admitting: Internal Medicine

## 2021-10-12 MED ORDER — METHYLPHENIDATE HCL ER (OSM) 27 MG PO TBCR: EXTENDED_RELEASE_TABLET | ORAL | 0 refills | Status: DC

## 2021-11-26 ENCOUNTER — Other Ambulatory Visit: Payer: Self-pay | Admitting: Internal Medicine

## 2021-11-26 DIAGNOSIS — F900 Attention-deficit hyperactivity disorder, predominantly inattentive type: Secondary | ICD-10-CM

## 2021-11-26 MED ORDER — AMPHETAMINE-DEXTROAMPHETAMINE 20 MG PO TABS: ORAL_TABLET | ORAL | 0 refills | Status: DC

## 2021-12-09 ENCOUNTER — Encounter: Payer: Self-pay | Admitting: Internal Medicine

## 2021-12-14 ENCOUNTER — Ambulatory Visit (INDEPENDENT_AMBULATORY_CARE_PROVIDER_SITE_OTHER): Payer: BC Managed Care – PPO | Admitting: Internal Medicine

## 2021-12-14 ENCOUNTER — Encounter: Payer: Self-pay | Admitting: Internal Medicine

## 2021-12-14 VITALS — BP 128/78 | HR 69 | Temp 97.2°F | Resp 16 | Ht 68.0 in | Wt 212.2 lb

## 2021-12-14 DIAGNOSIS — R5383 Other fatigue: Secondary | ICD-10-CM

## 2021-12-14 DIAGNOSIS — R350 Frequency of micturition: Secondary | ICD-10-CM | POA: Diagnosis not present

## 2021-12-14 DIAGNOSIS — Z136 Encounter for screening for cardiovascular disorders: Secondary | ICD-10-CM | POA: Diagnosis not present

## 2021-12-14 DIAGNOSIS — I10 Essential (primary) hypertension: Secondary | ICD-10-CM | POA: Diagnosis not present

## 2021-12-14 DIAGNOSIS — Z87891 Personal history of nicotine dependence: Secondary | ICD-10-CM | POA: Diagnosis not present

## 2021-12-14 DIAGNOSIS — Z1329 Encounter for screening for other suspected endocrine disorder: Secondary | ICD-10-CM

## 2021-12-14 DIAGNOSIS — Z111 Encounter for screening for respiratory tuberculosis: Secondary | ICD-10-CM

## 2021-12-14 DIAGNOSIS — R7309 Other abnormal glucose: Secondary | ICD-10-CM

## 2021-12-14 DIAGNOSIS — Z13220 Encounter for screening for lipoid disorders: Secondary | ICD-10-CM | POA: Diagnosis not present

## 2021-12-14 DIAGNOSIS — Z130 Encounter for screening for diseases of the blood and blood-forming organs and certain disorders involving the immune mechanism: Secondary | ICD-10-CM | POA: Diagnosis not present

## 2021-12-14 DIAGNOSIS — Z125 Encounter for screening for malignant neoplasm of prostate: Secondary | ICD-10-CM | POA: Diagnosis not present

## 2021-12-14 DIAGNOSIS — Z79899 Other long term (current) drug therapy: Secondary | ICD-10-CM

## 2021-12-14 DIAGNOSIS — Z131 Encounter for screening for diabetes mellitus: Secondary | ICD-10-CM

## 2021-12-14 DIAGNOSIS — N401 Enlarged prostate with lower urinary tract symptoms: Secondary | ICD-10-CM

## 2021-12-14 DIAGNOSIS — I700 Atherosclerosis of aorta: Secondary | ICD-10-CM

## 2021-12-14 DIAGNOSIS — F988 Other specified behavioral and emotional disorders with onset usually occurring in childhood and adolescence: Secondary | ICD-10-CM

## 2021-12-14 DIAGNOSIS — E782 Mixed hyperlipidemia: Secondary | ICD-10-CM

## 2021-12-14 DIAGNOSIS — Z0000 Encounter for general adult medical examination without abnormal findings: Secondary | ICD-10-CM

## 2021-12-14 DIAGNOSIS — Z1389 Encounter for screening for other disorder: Secondary | ICD-10-CM | POA: Diagnosis not present

## 2021-12-14 DIAGNOSIS — Z8249 Family history of ischemic heart disease and other diseases of the circulatory system: Secondary | ICD-10-CM

## 2021-12-14 DIAGNOSIS — E559 Vitamin D deficiency, unspecified: Secondary | ICD-10-CM

## 2021-12-14 DIAGNOSIS — Z0001 Encounter for general adult medical examination with abnormal findings: Secondary | ICD-10-CM

## 2021-12-14 DIAGNOSIS — Z1211 Encounter for screening for malignant neoplasm of colon: Secondary | ICD-10-CM

## 2021-12-14 NOTE — Progress Notes (Signed)
? ?Annual  Screening/Preventative Visit  ?& Comprehensive Evaluation & Examination ?Future Appointments  ?Date Time Provider Department  ?12/14/2021 10:00 AM Unk Pinto, MD [facility]  ?12/20/2022 10:00 AM Unk Pinto, MD [facility]  ? ?    ?     This very nice 56 y.o. MWM presents for a Screening /Preventative Visit & comprehensive evaluation and management of multiple medical co-morbidities.  Patient has been followed for HTN, HLD, Prediabetes and Vitamin D Deficiency. Patient is on Adderall for ADD with improved focus & concentration.  ? ? ?    HTN predates since 2019. Patient's BP has been controlled at home.  Today's BP is at goal -  128/78. Patient denies any cardiac symptoms as chest pain, palpitations, shortness of breath, dizziness or ankle swelling. ? ? ?    Patient's hyperlipidemia is controlled with diet . Last lipids were at goal : ? ?Lab Results  ?Component Value Date  ? CHOL 155 12/14/2021  ? HDL 37 (L) 12/14/2021  ? [place] 98 12/14/2021  ? TRIG 100 12/14/2021  ? CHOLHDL 4.2 12/14/2021  ? ? ? ?    Patient is followed expectantly for glucose intolerance and patient denies reactive hypoglycemic symptoms, visual blurring, diabetic polys or paresthesias. Last A1c was normal & at goal : ?  ?Lab Results  ?Component Value Date  ? HGBA1C 4.7 12/14/2021  ?  ? ? ?    Finally, patient has history of Vitamin D Deficiency  and last vitamin D was at goal : ?  ?Lab Results  ?Component Value Date  ? VD25OH 75 12/14/2021  ? ? ? ?Current Outpatient Medications on File Prior to Visit  ?Medication Sig  ? atenolol 50 MG tablet Take  1 tablet  Daily   ? ADDERALL 20 MG tablet Take  1/2 to 1 tablet  2 x /day  as needed   ? VITAMIN C 500 MG  Take 1 capsule  daily.  ? VITAMIN D 5000 u Take 2 capsules daily.  ? escitalopram (LEXAPRO) 20 MG tablet Take 1 tablet Daily   ? Multiple Vitamin  Take 1 tablet  daily.  ? OTC iron Takes  1 tablet daily.  ? zinc 50 MG tablet Take  daily.  ? ? ?Allergies  ?Allergen Reactions  ?  Penicillins   ? ? ?No past medical history on file. ? ? ?Health Maintenance  ?Topic Date Due  ? HIV Screening  Never done  ? Hepatitis C Screening  Never done  ? Zoster Vaccines- Shingrix (1 of 2) Never done  ? COVID-19 Vaccine (3 - Pfizer risk series) 11/26/2019  ? INFLUENZA VACCINE  03/08/2022  ? Fecal DNA (Cologuard)  01/10/2023  ? TETANUS/TDAP  11/10/2027  ? HPV VACCINES  Aged Out  ? ? ? ?Immunization History  ?Administered Date(s) Administered  ? Influenza Inj Mdck Quad  06/11/2020  ? Influenza, Quadrivalent, Recombinant 08/05/2019  ? Influenza,inj,Quad  06/21/2021  ? PFIZER-SARS-COV-2 Vacci 10/05/2019, 10/29/2019  ? PPD Test 12/03/2018, 12/09/2019, 12/08/2020  ? Tdap 11/09/2017  ? ? ?Cologard - 01/08/2020 - Negative - 3 year f/u due June 2024 ?  ? ?No past surgical history on file. ? ? ?Family History  ?Problem Relation Age of Onset  ? Stroke Mother   ? Heart disease Mother   ? Stroke Father   ? Hypertension Father   ? Heart disease Father   ? Hypertension Brother   ? ? ? ?Social History  ? ?Tobacco Use  ? Smoking status: Former  ?  Types: Cigarettes  ?  Quit date: 10/17/2016  ?  Years since quitting: 5.1  ? Smokeless tobacco: Never  ?Substance Use Topics  ? Alcohol use: Never  ? Drug use: Never  ? ? ? ? ROS ?Constitutional: Denies fever, chills, weight loss/gain, headaches, insomnia,  night sweats or change in appetite. Does c/o fatigue. ?Eyes: Denies redness, blurred vision, diplopia, discharge, itchy or watery eyes.  ?ENT: Denies discharge, congestion, post nasal drip, epistaxis, sore throat, earache, hearing loss, dental pain, Tinnitus, Vertigo, Sinus pain or snoring.  ?Cardio: Denies chest pain, palpitations, irregular heartbeat, syncope, dyspnea, diaphoresis, orthopnea, PND, claudication or edema ?Respiratory: denies cough, dyspnea, DOE, pleurisy, hoarseness, laryngitis or wheezing.  ?Gastrointestinal: Denies dysphagia, heartburn, reflux, water brash, pain, cramps, nausea, vomiting, bloating, diarrhea,  constipation, hematemesis, melena, hematochezia, jaundice or hemorrhoids ?Genitourinary: Denies dysuria, frequency, urgency, nocturia, hesitancy, discharge, hematuria or flank pain ?Musculoskeletal: Denies arthralgia, myalgia, stiffness, Jt. Swelling, pain, limp or strain/sprain. Denies Falls. ?Skin: Denies puritis, rash, hives, warts, acne, eczema or change in skin lesion ?Neuro: No weakness, tremor, incoordination, spasms, paresthesia or pain ?Psychiatric: Denies confusion, memory loss or sensory loss. Denies Depression. ?Endocrine: Denies change in weight, skin, hair change, nocturia, and paresthesia, diabetic polys, visual blurring or hyper / hypo glycemic episodes.  ?Heme/Lymph: No excessive bleeding, bruising or enlarged lymph nodes. ? ? ?Physical Exam ? ?BP 128/78   Pulse 69   Temp (!) 97.2 ?F (36.2 ?C)   Resp 16   Ht '5\' 8"'$  (1.727 m)   Wt 212 lb 3.2 oz (96.3 kg)   SpO2 97%   BMI 32.26 kg/m?  ? ?General Appearance: Well nourished and well groomed and in no apparent distress. ? ?Eyes: PERRLA, EOMs, conjunctiva no swelling or erythema, normal fundi and vessels. ?Sinuses: No frontal/maxillary tenderness ?ENT/Mouth: EACs patent / TMs  nl. Nares clear without erythema, swelling, mucoid exudates. Oral hygiene is good. No erythema, swelling, or exudate. Tongue normal, non-obstructing. Tonsils not swollen or erythematous. Hearing normal.  ?Neck: Supple, thyroid not palpable. No bruits, nodes or JVD. ?Respiratory: Respiratory effort normal.  BS equal and clear bilateral without rales, rhonci, wheezing or stridor. ?Cardio: Heart sounds are normal with regular rate and rhythm and no murmurs, rubs or gallops. Peripheral pulses are normal and equal bilaterally without edema. No aortic or femoral bruits. ?Chest: symmetric with normal excursions and percussion.  ?Abdomen: Soft, with Nl bowel sounds. Nontender, no guarding, rebound, hernias, masses, or organomegaly.  ?Lymphatics: Non tender without lymphadenopathy.   ?Musculoskeletal: Full ROM all peripheral extremities, joint stability, 5/5 strength, and normal gait. ?Skin: Warm and dry without rashes, lesions, cyanosis, clubbing or  ecchymosis.  ?Neuro: Cranial nerves intact, reflexes equal bilaterally. Normal muscle tone, no cerebellar symptoms. Sensation intact.  ?Pysch: Alert and oriented X 3 with normal affect, insight and judgment appropriate.  ? ?Assessment and Plan ? ?1. Annual Preventative/Screening Exam  ? ? ?2. Essential hypertension ? ?- EKG 12-Lead ?- [place], RETROPERITNL ABD,  LTD ?- Urinalysis, Routine w reflex microscopic ?- Microalbumin / creatinine urine ratio ?- CBC with Differential/Platelet ?- COMPLETE METABOLIC PANEL WITH GFR ?- Magnesium ?- TSH ? ?3. Hyperlipidemia, mixed ? ?- EKG 12-Lead ?- [place], RETROPERITNL ABD,  LTD ?- Lipid panel ?- TSH ? ?4. Abnormal glucose ? ?- EKG 12-Lead ?- [place], RETROPERITNL ABD,  LTD ?- Hemoglobin A1c ?- Insulin, random ? ?5. Vitamin D deficiency ? ?- VITAMIN D 25 Hydroxy  ? ?6. Attention deficit disorder (ADD) without hyperactivity ? ? ?7. Screening for colorectal cancer ? ?-  POC Hemoccult Bld/Stl  ? ?8. Screening for heart disease ? ?- EKG 12-Lead ? ?9. Prostate cancer screening ? ?- PSA ? ?10. FHx: heart disease ? ?- EKG 12-Lead ?- [place], RETROPERITNL ABD,  LTD ? ?11. Screening-pulmonary TB ? ?- TB Skin Test ? ?12. Former smoker ? ?- EKG 12-Lead ?- [place], RETROPERITNL ABD,  LTD ? ?13. Fatigue, unspecified type ? ?- Iron, Total/Total Iron Binding Cap ?- Vitamin B12 ?- Testosterone ?- CBC with Differential/Platelet ?- TSH ? ?14. Screening for AAA (aortic abdominal aneurysm) ? ?- [place], RETROPERITNL ABD,  LTD ? ?15. Medication management ? ?- Urinalysis, Routine w reflex microscopic ?- Microalbumin / creatinine urine ratio ?- CBC with Differential/Platelet ?- COMPLETE METABOLIC PANEL WITH GFR ?- Magnesium ?- Lipid panel ?- TSH ?- Hemoglobin A1c ?- Insulin, random ?- VITAMIN D 25 Hydroxy  ? ? ?       Patient was counseled in prudent diet,  weight control to achieve/maintain BMI less than 25, BP monitoring, regular exercise and medications as discussed.  Discussed med effects and SE's. Routine screening labs and tests as requested with regular follow-up

## 2021-12-14 NOTE — Patient Instructions (Signed)

## 2021-12-14 NOTE — Progress Notes (Signed)
Aorta Scan <3 

## 2021-12-15 LAB — URINALYSIS, ROUTINE W REFLEX MICROSCOPIC
Bilirubin Urine: NEGATIVE
Glucose, UA: NEGATIVE
Hgb urine dipstick: NEGATIVE
Ketones, ur: NEGATIVE
Leukocytes,Ua: NEGATIVE
Nitrite: NEGATIVE
Protein, ur: NEGATIVE
Specific Gravity, Urine: 1.014 (ref 1.001–1.035)
pH: 6 (ref 5.0–8.0)

## 2021-12-15 LAB — LIPID PANEL
Cholesterol: 155 mg/dL (ref <200)
HDL: 37 mg/dL — ABNORMAL LOW (ref >=40)
LDL Cholesterol (Calc): 98 mg/dL (calc)
Non-HDL Cholesterol (Calc): 118 mg/dL (calc) (ref <130)
Total CHOL/HDL Ratio: 4.2 (calc) (ref <5.0)
Triglycerides: 100 mg/dL (ref <150)

## 2021-12-15 LAB — CBC WITH DIFFERENTIAL/PLATELET
Absolute Monocytes: 400 cells/uL (ref 200–950)
Basophils Absolute: 31 cells/uL (ref 0–200)
Basophils Relative: 0.6 %
Eosinophils Absolute: 62 cells/uL (ref 15–500)
Eosinophils Relative: 1.2 %
HCT: 43.9 % (ref 38.5–50.0)
Hemoglobin: 14.8 g/dL (ref 13.2–17.1)
Lymphs Abs: 1201 cells/uL (ref 850–3900)
MCH: 29.9 pg (ref 27.0–33.0)
MCHC: 33.7 g/dL (ref 32.0–36.0)
MCV: 88.7 fL (ref 80.0–100.0)
MPV: 9.8 fL (ref 7.5–12.5)
Monocytes Relative: 7.7 %
Neutro Abs: 3505 cells/uL (ref 1500–7800)
Neutrophils Relative %: 67.4 %
Platelets: 184 10*3/uL (ref 140–400)
RBC: 4.95 10*6/uL (ref 4.20–5.80)
RDW: 12.5 % (ref 11.0–15.0)
Total Lymphocyte: 23.1 %
WBC: 5.2 10*3/uL (ref 3.8–10.8)

## 2021-12-15 LAB — COMPLETE METABOLIC PANEL WITH GFR
AG Ratio: 1.9 (calc) (ref 1.0–2.5)
ALT: 28 U/L (ref 9–46)
AST: 17 U/L (ref 10–35)
Albumin: 4.7 g/dL (ref 3.6–5.1)
Alkaline phosphatase (APISO): 83 U/L (ref 35–144)
BUN: 23 mg/dL (ref 7–25)
CO2: 24 mmol/L (ref 20–32)
Calcium: 9.5 mg/dL (ref 8.6–10.3)
Chloride: 104 mmol/L (ref 98–110)
Creat: 0.89 mg/dL (ref 0.70–1.30)
Globulin: 2.5 g/dL (calc) (ref 1.9–3.7)
Glucose, Bld: 86 mg/dL (ref 65–99)
Potassium: 4.4 mmol/L (ref 3.5–5.3)
Sodium: 138 mmol/L (ref 135–146)
Total Bilirubin: 0.5 mg/dL (ref 0.2–1.2)
Total Protein: 7.2 g/dL (ref 6.1–8.1)
eGFR: 101 mL/min/{1.73_m2} (ref >=60)

## 2021-12-15 LAB — HEMOGLOBIN A1C
Hgb A1c MFr Bld: 4.7 % of total Hgb (ref <5.7)
Mean Plasma Glucose: 88 mg/dL
eAG (mmol/L): 4.9 mmol/L

## 2021-12-15 LAB — IRON, TOTAL/TOTAL IRON BINDING CAP
%SAT: 21 % (calc) (ref 20–48)
Iron: 61 ug/dL (ref 50–180)
TIBC: 294 mcg/dL (calc) (ref 250–425)

## 2021-12-15 LAB — TSH: TSH: 1.15 mIU/L (ref 0.40–4.50)

## 2021-12-15 LAB — VITAMIN B12: Vitamin B-12: 582 pg/mL (ref 200–1100)

## 2021-12-15 LAB — VITAMIN D 25 HYDROXY (VIT D DEFICIENCY, FRACTURES): Vit D, 25-Hydroxy: 75 ng/mL (ref 30–100)

## 2021-12-15 LAB — PSA: PSA: 0.61 ng/mL (ref <=4.00)

## 2021-12-15 LAB — MAGNESIUM: Magnesium: 2.3 mg/dL (ref 1.5–2.5)

## 2021-12-15 LAB — MICROALBUMIN / CREATININE URINE RATIO
Creatinine, Urine: 69 mg/dL (ref 20–320)
Microalb, Ur: <0.2 mg/dL

## 2021-12-15 LAB — TESTOSTERONE: Testosterone: 299 ng/dL (ref 250–827)

## 2021-12-15 LAB — INSULIN, RANDOM: Insulin: 17.3 u[IU]/mL

## 2021-12-17 LAB — TB SKIN TEST
Induration: 0 mm
TB Skin Test: NEGATIVE

## 2021-12-19 ENCOUNTER — Encounter: Payer: Self-pay | Admitting: Internal Medicine

## 2021-12-30 ENCOUNTER — Other Ambulatory Visit: Payer: Self-pay | Admitting: Internal Medicine

## 2021-12-30 DIAGNOSIS — F419 Anxiety disorder, unspecified: Secondary | ICD-10-CM

## 2022-02-09 ENCOUNTER — Other Ambulatory Visit: Payer: Self-pay | Admitting: Internal Medicine

## 2022-02-09 DIAGNOSIS — F900 Attention-deficit hyperactivity disorder, predominantly inattentive type: Secondary | ICD-10-CM

## 2022-02-09 MED ORDER — AMPHETAMINE-DEXTROAMPHETAMINE 20 MG PO TABS: ORAL_TABLET | ORAL | 0 refills | Status: DC

## 2022-03-17 ENCOUNTER — Encounter: Payer: Self-pay | Admitting: Nurse Practitioner

## 2022-03-17 ENCOUNTER — Ambulatory Visit (INDEPENDENT_AMBULATORY_CARE_PROVIDER_SITE_OTHER): Payer: BC Managed Care – PPO | Admitting: Nurse Practitioner

## 2022-03-17 VITALS — BP 118/74 | HR 66 | Temp 97.3°F | Ht 68.0 in | Wt 212.0 lb

## 2022-03-17 DIAGNOSIS — I10 Essential (primary) hypertension: Secondary | ICD-10-CM

## 2022-03-17 DIAGNOSIS — F900 Attention-deficit hyperactivity disorder, predominantly inattentive type: Secondary | ICD-10-CM

## 2022-03-17 DIAGNOSIS — E782 Mixed hyperlipidemia: Secondary | ICD-10-CM

## 2022-03-17 DIAGNOSIS — R7309 Other abnormal glucose: Secondary | ICD-10-CM

## 2022-03-17 DIAGNOSIS — E559 Vitamin D deficiency, unspecified: Secondary | ICD-10-CM

## 2022-03-17 DIAGNOSIS — K219 Gastro-esophageal reflux disease without esophagitis: Secondary | ICD-10-CM

## 2022-03-17 DIAGNOSIS — E669 Obesity, unspecified: Secondary | ICD-10-CM | POA: Diagnosis not present

## 2022-03-17 NOTE — Progress Notes (Signed)
FOLLOW UP  Assessment and Plan:   Hypertension Well controlled with current medications  Monitor blood pressure at home; patient to call if consistently greater than 130/80 Continue DASH diet.   Reminder to go to the ER if any CP, SOB, nausea, dizziness, severe HA, changes vision/speech, left arm numbness and tingling and jaw pain.  Cholesterol Currently at goal;  Continue low cholesterol diet and exercise.  Check lipid panel.   Abnormal Glucose  Education: Reviewed 'ABCs' of diabetes management  A1C (<7) Blood pressure (<130/80) Cholesterol (LDL <70) Continue Eye Exam yearly  Continue Dental Exam Q6 mo Discussed dietary recommendations Discussed Physical Activity recommendations Check A1C    Obesity with co morbidities Discussed appropriate BMI Goal of losing 1 lb per month. Diet modification. Physical activity. Encouraged/praised to build confidence.   Vitamin D Def At goal at last visit; continue supplementation to maintain goal of 60-100 Monitor Vitamin D levels  ADHD Well managed. Continue medication as directed.  GERD No suspected reflux complications ([place]/stricture). Lifestyle modification:  wt loss, avoid meals 2-3h before bedtime. Consider eliminating food triggers:  chocolate, caffeine, EtOH, acid/spicy food.  Review of last lab work 12/14/2021 WNL.  Defer to next OV in 3 months.   Continue diet and meds as discussed. Further disposition pending results of labs. Discussed med's effects and SE's.    Over 30 minutes of exam, counseling, chart review, and critical decision making was performed.   Future Appointments  Date Time Provider [place]  06/20/2022  9:30 AM Unk Pinto, MD [facility] None  12/20/2022 10:00 AM Unk Pinto, MD [facility] None    ----------------------------------------------------------------------------------------------------------------------  HPI 56 y.o. male  presents for 3 month follow up on  hypertension, cholesterol, diabetes, weight and vitamin D deficiency.   Overall he reports feeling well.  He has no additional concerns today in clinic.  He continue Adderall 20 mg PRN for focus and concentration.  Medication is currently effective.  His GERD is managed by avoiding triggers.  Takes OTC H2 blocker PRN.   BMI is Body mass index is 32.23 kg/m., he has been working on diet and exercise. Wt Readings from Last 3 Encounters:  03/17/22 212 lb (96.2 kg)  12/14/21 212 lb 3.2 oz (96.3 kg)  06/21/21 203 lb 3.2 oz (92.2 kg)    His blood pressure has been controlled at home, today their BP is BP: 118/74  He does not workout. He denies chest pain, shortness of breath, dizziness.   He is not on cholesterol medication. His cholesterol is at goal. The cholesterol last visit was:   Lab Results  Component Value Date   CHOL 155 12/14/2021   HDL 37 (L) 12/14/2021   LDLCALC 98 12/14/2021   TRIG 100 12/14/2021   CHOLHDL 4.2 12/14/2021    He has been working on diet and exercise for prediabetes, and denies polydipsia and polyuria. Last A1C in the office was:  Lab Results  Component Value Date   HGBA1C 4.7 12/14/2021   Patient is on Vitamin D supplement.   Lab Results  Component Value Date   VD25OH 46 12/14/2021        Current Medications:  Current Outpatient Medications on File Prior to Visit  Medication Sig   amphetamine-dextroamphetamine (ADDERALL) 20 MG tablet Take  1/2 to 1 tablet  2 x /day  as needed for Focus & Concentration   Ascorbic Acid (VITAMIN C) 500 MG CAPS Take 1 capsule by mouth daily.   atenolol (TENORMIN) 50 MG tablet Take  1 tablet  Daily  for BP                                     /                 TAKE 1 TABLET BY MOUTH   Cholecalciferol (VITAMIN D) 125 MCG (5000 UT) CAPS Take 2 capsules by mouth daily.   escitalopram (LEXAPRO) 20 MG tablet TAKE 1 TABLET BY MOUTH DAILY FOR MOOD   Multiple Vitamin (MULTIVITAMIN) tablet Take 1 tablet by mouth daily.   OVER  THE COUNTER MEDICATION Takes OTC iron 1 tablet daily.   zinc gluconate 50 MG tablet Take 50 mg by mouth daily.   No current facility-administered medications on file prior to visit.     Allergies:  Allergies  Allergen Reactions   Penicillins      Medical History: No past medical history on file. Family history- Reviewed and unchanged Social history- Reviewed and unchanged   Review of Systems:  ROS    Physical Exam: BP 118/74   Pulse 66   Temp (!) 97.3 F (36.3 C)   Ht '5\' 8"'$  (1.727 m)   Wt 212 lb (96.2 kg)   SpO2 96%   BMI 32.23 kg/m  Wt Readings from Last 3 Encounters:  03/17/22 212 lb (96.2 kg)  12/14/21 212 lb 3.2 oz (96.3 kg)  06/21/21 203 lb 3.2 oz (92.2 kg)   General Appearance: Well nourished, in no apparent distress. Eyes: PERRLA, EOMs, conjunctiva no swelling or erythema Sinuses: No Frontal/maxillary tenderness ENT/Mouth: Ext aud canals clear, TMs without erythema, bulging. No erythema, swelling, or exudate on post pharynx.  Tonsils not swollen or erythematous. Hearing normal.  Neck: Supple, thyroid normal.  Respiratory: Respiratory effort normal, BS equal bilaterally without rales, rhonchi, wheezing or stridor.  Cardio: RRR with no MRGs. Brisk peripheral pulses without edema.  Abdomen: Soft, + BS.  Non tender, no guarding, rebound, hernias, masses. Lymphatics: Non tender without lymphadenopathy.  Musculoskeletal: Full ROM, 5/5 strength, Normal gait Skin: Warm, dry without rashes, lesions, ecchymosis.  Neuro: Cranial nerves intact. No cerebellar symptoms.  Psych: Awake and oriented X 3, normal affect, Insight and Judgment appropriate.    Darrol Jump, NP 11:02 AM [facility]

## 2022-03-17 NOTE — Patient Instructions (Signed)

## 2022-04-15 ENCOUNTER — Other Ambulatory Visit: Payer: Self-pay | Admitting: Nurse Practitioner

## 2022-04-15 ENCOUNTER — Telehealth: Payer: Self-pay

## 2022-04-15 DIAGNOSIS — F900 Attention-deficit hyperactivity disorder, predominantly inattentive type: Secondary | ICD-10-CM

## 2022-04-15 MED ORDER — AMPHETAMINE-DEXTROAMPHETAMINE 20 MG PO TABS: ORAL_TABLET | ORAL | 0 refills | Status: DC

## 2022-04-15 NOTE — Telephone Encounter (Signed)
Refill request for Adderall

## 2022-06-19 NOTE — Progress Notes (Unsigned)
Future Appointments  Date Time Provider Department  06/20/2022  9:30 AM Unk Pinto, MD [facility]  12/20/2022 10:00 AM Unk Pinto, MD [facility]     History of Present Illness:       This very nice 56 y.o. MWM presents for 6  month follow up with HTN, HLD, Pre-Diabetes and Vitamin D Deficiency.  Patient also is on Adderall  for ADD  with improved focus & concentration.        Patient is c/o ongoing numb dysthesias of the LUE from the neck to fingers . He requests trigger point injection .        Patient is treated for HTN  since 2019  & BP has been controlled at home.   BP is  at goal - 128/82 .  Patient has had no complaints of any cardiac type chest pain, palpitations, dyspnea Vertell Limber /PND, dizziness, claudication or dependent edema.        Patient's Lipids are controlled with diet. Last Lipids were at goal :  Lab Results  Component Value Date   CHOL 155 12/14/2021   HDL 37 (L) 12/14/2021   LDLCALC 98 12/14/2021   TRIG 100 12/14/2021   CHOLHDL 4.2 12/14/2021     Also, the patient his followed  expectantly for glucose intolerance and has had no symptoms of reactive hypoglycemia, diabetic polys, paresthesias or visual blurring.  Last A1c was normal & at goal :   Lab Results  Component Value Date   HGBA1C 4.7 12/14/2021                                                          Further, the patient also has history of Vitamin D Deficiency and supplements vitamin D without any suspected side-effects. Last vitamin D was at goal :   Lab Results  Component Value Date   VD25OH 33 12/14/2021       Current Outpatient Medications  Medication Instructions   ADDERALL 20 MG  Take  1/2 to 1 tablet  2 x /day   VITAMIN C 500 MG 1 capsule Daily   atenolol  50 MG  Take  1 tablet  Daily    VITAMIN D  5000 u 2 capsules  Daily   escitalopram  20 MG tablet TAKE 1 TABLET BY MOUTH DAILY    Multiple Vitamin  1 tablet  Daily   OTC iron  Takes 1 tablet daily.     zinc  50 mg  Daily     Allergies  Allergen Reactions   Penicillins     PMHx:  HTN, ADD, Vitamin D Deficiency.   Immunization History  Administered Date(s) Administered   Influenza Inj Mdck Quad 06/11/2020   Influenza, Quad, Recombinant 08/05/2019   PFIZER SARS-COV-2 Vacc  10/05/2019, 10/29/2019   PPD Test 12/03/2018, 12/09/2019, 12/08/2020   Tdap 11/09/2017    No past surgical history on file.   FHx:    Reviewed / unchanged  SHx:    Reviewed / unchanged   Systems Review:  Constitutional: Denies fever, chills, wt changes, headaches, insomnia, fatigue, night sweats, change in appetite. Eyes: Denies redness, blurred vision, diplopia, discharge, itchy, watery eyes.  ENT: Denies discharge, congestion, post nasal drip, epistaxis, sore throat, earache, hearing loss, dental pain, tinnitus, vertigo, sinus pain, snoring.  CV:  Denies chest pain, palpitations, irregular heartbeat, syncope, dyspnea, diaphoresis, orthopnea, PND, claudication or edema. Respiratory: denies cough, dyspnea, DOE, pleurisy, hoarseness, laryngitis, wheezing.  Gastrointestinal: Denies dysphagia, odynophagia, heartburn, reflux, water brash, abdominal pain or cramps, nausea, vomiting, bloating, diarrhea, constipation, hematemesis, melena, hematochezia  or hemorrhoids. Genitourinary: Denies dysuria, frequency, urgency, nocturia, hesitancy, discharge, hematuria or flank pain. Musculoskeletal: Denies arthralgias, myalgias, stiffness, jt. swelling, pain, limping or strain/sprain.  Skin: Denies pruritus, rash, hives, warts, acne, eczema or change in skin lesion(s). Neuro: No weakness, tremor, incoordination, spasms, paresthesia or pain. Psychiatric: Denies confusion, memory loss or sensory loss. Endo: Denies change in weight, skin or hair change.  Heme/Lymph: No excessive bleeding, bruising or enlarged lymph nodes.  Physical Exam  BP 128/82   Pulse 80   Temp 97.8 F (36.6 C)   Resp 16   Ht '5\' 8"'$  (1.727 m)   Wt  214 lb 3.2 oz (97.2 kg)   SpO2 96%   BMI 32.57 kg/m   Appears  well nourished, well groomed  and in no distress.  Eyes: PERRLA, EOMs, conjunctiva no swelling or erythema. Sinuses: No frontal/maxillary tenderness ENT/Mouth: EAC's clear, TM's nl w/o erythema, bulging. Nares clear w/o erythema, swelling, exudates. Oropharynx clear without erythema or exudates. Oral hygiene is good. Tongue normal, non obstructing. Hearing intact.  Neck: Supple. Thyroid not palpable. Car 2+/2+ without bruits, nodes or JVD.             (CPT -  [phone]) After identifying the trigger point of the Lt lateral neck & after informed consent,                                        and aseptic prep with alcohol , the area was injected with                                         2 ml of Dexamethasone & 1 cc of Marcaine 0.5%   Chest: Respirations nl with BS clear & equal w/o rales, rhonchi, wheezing or stridor.  Cor: Heart sounds normal w/ regular rate and rhythm without sig. murmurs, gallops, clicks or rubs. Peripheral pulses normal and equal  without edema.  Abdomen: Soft & bowel sounds normal. Non-tender w/o guarding, rebound, hernias, masses or organomegaly.  Lymphatics: Unremarkable.  Musculoskeletal: Full ROM all peripheral extremities, joint stability, 5/5 strength and normal gait.  Skin: Warm, dry without exposed rashes, lesions or ecchymosis apparent.  Neuro: Cranial nerves intact, reflexes equal bilaterally. Sensory-motor testing grossly intact. Tendon reflexes grossly intact.  Pysch: Alert & oriented x 3.  Insight and judgement nl & appropriate. No ideations.  Assessment and Plan:   1. Essential hypertension  - CBC with Differential/Platelet - COMPLETE METABOLIC PANEL WITH GFR - Magnesium - TSH  2. Hyperlipidemia, mixed  - Lipid panel - TSH  3. Abnormal glucose  - Hemoglobin A1c - Insulin, random  4. Vitamin D deficiency  - VITAMIN D 25 Hydroxy   5. Need for immunization against  influenza  - Flu Vaccine QUAD 6+ mos PF IM (Fluarix Quad PF)  6. Trigger point with neck pain   7. Cervical radiculopathy   8. Medication management  - CBC with Differential/Platelet - COMPLETE METABOLIC PANEL WITH GFR - Magnesium - Lipid panel - TSH - Hemoglobin A1c - Insulin, random -  VITAMIN D 25 Hydroxy          Discussed  regular exercise, BP monitoring, weight control to achieve/maintain BMI less than 25 and discussed med and SE's. Recommended labs to assess and monitor clinical status with further disposition pending results of labs.  I discussed the assessment and treatment plan with the patient. The patient was provided an opportunity to ask questions and all were answered. The patient agreed with the plan and demonstrated an understanding of the instructions.  I provided over 30 minutes of exam, counseling, chart review and  complex critical decision making.       The patient was advised to call back or seek an in-person evaluation if the symptoms worsen or if the condition fails to improve as anticipated.   Kirtland Bouchard, MD

## 2022-06-20 ENCOUNTER — Encounter: Payer: Self-pay | Admitting: Internal Medicine

## 2022-06-20 ENCOUNTER — Ambulatory Visit (INDEPENDENT_AMBULATORY_CARE_PROVIDER_SITE_OTHER): Payer: BC Managed Care – PPO | Admitting: Internal Medicine

## 2022-06-20 VITALS — BP 128/82 | HR 80 | Temp 97.8°F | Resp 16 | Ht 68.0 in | Wt 214.2 lb

## 2022-06-20 DIAGNOSIS — Z23 Encounter for immunization: Secondary | ICD-10-CM

## 2022-06-20 DIAGNOSIS — E559 Vitamin D deficiency, unspecified: Secondary | ICD-10-CM

## 2022-06-20 DIAGNOSIS — Z79899 Other long term (current) drug therapy: Secondary | ICD-10-CM | POA: Diagnosis not present

## 2022-06-20 DIAGNOSIS — M5412 Radiculopathy, cervical region: Secondary | ICD-10-CM

## 2022-06-20 DIAGNOSIS — R7309 Other abnormal glucose: Secondary | ICD-10-CM

## 2022-06-20 DIAGNOSIS — I10 Essential (primary) hypertension: Secondary | ICD-10-CM

## 2022-06-20 DIAGNOSIS — E782 Mixed hyperlipidemia: Secondary | ICD-10-CM | POA: Diagnosis not present

## 2022-06-20 DIAGNOSIS — M542 Cervicalgia: Secondary | ICD-10-CM

## 2022-06-20 NOTE — Patient Instructions (Signed)

## 2022-06-21 LAB — CBC WITH DIFFERENTIAL/PLATELET
Absolute Monocytes: 348 cells/uL (ref 200–950)
Basophils Absolute: 42 cells/uL (ref 0–200)
Basophils Relative: 0.7 %
Eosinophils Absolute: 72 cells/uL (ref 15–500)
Eosinophils Relative: 1.2 %
HCT: 39.6 % (ref 38.5–50.0)
Hemoglobin: 13.7 g/dL (ref 13.2–17.1)
Lymphs Abs: 1266 cells/uL (ref 850–3900)
MCH: 29.7 pg (ref 27.0–33.0)
MCHC: 34.6 g/dL (ref 32.0–36.0)
MCV: 85.9 fL (ref 80.0–100.0)
MPV: 9.5 fL (ref 7.5–12.5)
Monocytes Relative: 5.8 %
Neutro Abs: 4272 cells/uL (ref 1500–7800)
Neutrophils Relative %: 71.2 %
Platelets: 206 10*3/uL (ref 140–400)
RBC: 4.61 10*6/uL (ref 4.20–5.80)
RDW: 13 % (ref 11.0–15.0)
Total Lymphocyte: 21.1 %
WBC: 6 10*3/uL (ref 3.8–10.8)

## 2022-06-21 LAB — COMPLETE METABOLIC PANEL WITH GFR
AG Ratio: 2 (calc) (ref 1.0–2.5)
ALT: 31 U/L (ref 9–46)
AST: 22 U/L (ref 10–35)
Albumin: 4.4 g/dL (ref 3.6–5.1)
Alkaline phosphatase (APISO): 77 U/L (ref 35–144)
BUN: 24 mg/dL (ref 7–25)
CO2: 26 mmol/L (ref 20–32)
Calcium: 9.5 mg/dL (ref 8.6–10.3)
Chloride: 107 mmol/L (ref 98–110)
Creat: 0.99 mg/dL (ref 0.70–1.30)
Globulin: 2.2 g/dL (calc) (ref 1.9–3.7)
Glucose, Bld: 113 mg/dL — ABNORMAL HIGH (ref 65–99)
Potassium: 4.1 mmol/L (ref 3.5–5.3)
Sodium: 141 mmol/L (ref 135–146)
Total Bilirubin: 0.8 mg/dL (ref 0.2–1.2)
Total Protein: 6.6 g/dL (ref 6.1–8.1)
eGFR: 89 mL/min/{1.73_m2} (ref >=60)

## 2022-06-21 LAB — VITAMIN D 25 HYDROXY (VIT D DEFICIENCY, FRACTURES): Vit D, 25-Hydroxy: 67 ng/mL (ref 30–100)

## 2022-06-21 LAB — LIPID PANEL
Cholesterol: 147 mg/dL (ref <200)
HDL: 36 mg/dL — ABNORMAL LOW (ref >=40)
LDL Cholesterol (Calc): 94 mg/dL (calc)
Non-HDL Cholesterol (Calc): 111 mg/dL (calc) (ref <130)
Total CHOL/HDL Ratio: 4.1 (calc) (ref <5.0)
Triglycerides: 83 mg/dL (ref <150)

## 2022-06-21 LAB — HEMOGLOBIN A1C
Hgb A1c MFr Bld: 5.1 % of total Hgb (ref <5.7)
Mean Plasma Glucose: 100 mg/dL
eAG (mmol/L): 5.5 mmol/L

## 2022-06-21 LAB — MAGNESIUM: Magnesium: 2 mg/dL (ref 1.5–2.5)

## 2022-06-21 LAB — TSH: TSH: 0.78 mIU/L (ref 0.40–4.50)

## 2022-06-21 LAB — INSULIN, RANDOM: Insulin: 92.9 u[IU]/mL — ABNORMAL HIGH

## 2022-07-07 ENCOUNTER — Other Ambulatory Visit: Payer: Self-pay | Admitting: Internal Medicine

## 2022-07-07 DIAGNOSIS — F900 Attention-deficit hyperactivity disorder, predominantly inattentive type: Secondary | ICD-10-CM

## 2022-07-07 MED ORDER — AMPHETAMINE-DEXTROAMPHETAMINE 20 MG PO TABS: ORAL_TABLET | ORAL | 0 refills | Status: DC

## 2022-07-08 ENCOUNTER — Other Ambulatory Visit: Payer: Self-pay | Admitting: Internal Medicine

## 2022-07-08 DIAGNOSIS — I10 Essential (primary) hypertension: Secondary | ICD-10-CM

## 2022-09-26 ENCOUNTER — Other Ambulatory Visit: Payer: Self-pay | Admitting: Internal Medicine

## 2022-09-26 DIAGNOSIS — F900 Attention-deficit hyperactivity disorder, predominantly inattentive type: Secondary | ICD-10-CM

## 2022-09-26 MED ORDER — AMPHETAMINE-DEXTROAMPHETAMINE 20 MG PO TABS: ORAL_TABLET | ORAL | 0 refills | Status: DC

## 2022-12-01 ENCOUNTER — Other Ambulatory Visit: Payer: Self-pay | Admitting: Internal Medicine

## 2022-12-01 DIAGNOSIS — F900 Attention-deficit hyperactivity disorder, predominantly inattentive type: Secondary | ICD-10-CM

## 2022-12-01 MED ORDER — AMPHETAMINE-DEXTROAMPHETAMINE 20 MG PO TABS: ORAL_TABLET | ORAL | 0 refills | Status: DC

## 2022-12-19 NOTE — Progress Notes (Incomplete)
Annual  Screening/Preventative Visit  & Comprehensive Evaluation & Examination   Future Appointments  Date Time Provider Department  12/20/2022 10:00 AM Lucky Cowboy, MD [facility]  12/29/2023 10:00 AM Lucky Cowboy, MD [facility]            This very nice 57 y.o. MWM presents for a Screening /Preventative Visit & comprehensive evaluation and management of multiple medical co-morbidities.  Patient has been followed for HTN, HLD, Prediabetes and Vitamin D Deficiency. Patient is on Adderall for ADD with improved focus & concentration.         HTN predates since 2019. Patient's BP has been controlled and today's BP is at goal -                     . Patient denies any cardiac symptoms as chest pain, palpitations, shortness of breath, dizziness or ankle swelling.        Patient's hyperlipidemia is controlled with diet . Last lipids were at goal :  Lab Results  Component Value Date   CHOL 147 06/20/2022   HDL 36 (L) 06/20/2022   LDLCALC 94 06/20/2022   TRIG 83 06/20/2022   CHOLHDL 4.1 06/20/2022         Patient is followed expectantly for glucose intolerance and patient denies reactive hypoglycemic symptoms, visual blurring, diabetic polys or paresthesias. Last A1c was normal & at goal :   Lab Results  Component Value Date   HGBA1C 5.1 06/20/2022         Finally, patient has history of Vitamin D Deficiency  and last vitamin D was at goal :   Lab Results  Component Value Date   VD25OH 67 06/20/2022       Current Outpatient Medications on File Prior to Visit  Medication Sig  . atenolol 50 MG tablet Take  1 tablet  Daily   . ADDERALL 20 MG tablet Take  1/2 to 1 tablet  2 x /day  as needed   . VITAMIN C 500 MG  Take 1 capsule  daily.  Marland Kitchen VITAMIN D 5000 u Take 2 capsules daily.  Marland Kitchen escitalopram (LEXAPRO) 20 MG tablet Take 1 tablet Daily   . Multiple Vitamin  Take 1 tablet  daily.  . OTC iron Takes  1 tablet daily.  Marland Kitchen zinc 50 MG tablet Take  daily.      Allergies  Allergen Reactions  . Penicillins     No past medical history on file.   Health Maintenance  Topic Date Due  . HIV Screening  Never done  . Hepatitis C Screening  Never done  . Zoster Vaccines- Shingrix (1 of 2) Never done  . COVID-19 Vaccine (3 - Pfizer risk series) 11/26/2019  . INFLUENZA VACCINE  03/08/2022  . Fecal DNA (Cologuard)  01/10/2023  . TETANUS/TDAP  11/10/2027  . HPV VACCINES  Aged Out     Immunization History  Administered Date(s) Administered  . Influenza Inj Mdck Quad  06/11/2020  . Influenza, Quadrivalent, Recombinant 08/05/2019  . Influenza,inj,Quad  06/21/2021  . PFIZER-SARS-COV-2 Vacci 10/05/2019, 10/29/2019  . PPD Test 12/03/2018, 12/09/2019, 12/08/2020  . Tdap 11/09/2017    Cologard - 01/08/2020 - Negative - 3 year f/u due June 2024    No past surgical history on file.   Family History  Problem Relation Age of Onset  . Stroke Mother   . Heart disease Mother   . Stroke Father   . Hypertension Father   . Heart  disease Father   . Hypertension Brother      Social History   Tobacco Use  . Smoking status: Former    Types: Cigarettes    Quit date: 10/17/2016    Years since quitting: 5.1  . Smokeless tobacco: Never  Substance Use Topics  . Alcohol use: Never  . Drug use: Never      ROS Constitutional: Denies fever, chills, weight loss/gain, headaches, insomnia,  night sweats or change in appetite. Does c/o fatigue. Eyes: Denies redness, blurred vision, diplopia, discharge, itchy or watery eyes.  ENT: Denies discharge, congestion, post nasal drip, epistaxis, sore throat, earache, hearing loss, dental pain, Tinnitus, Vertigo, Sinus pain or snoring.  Cardio: Denies chest pain, palpitations, irregular heartbeat, syncope, dyspnea, diaphoresis, orthopnea, PND, claudication or edema Respiratory: denies cough, dyspnea, DOE, pleurisy, hoarseness, laryngitis or wheezing.  Gastrointestinal: Denies dysphagia, heartburn, reflux,  water brash, pain, cramps, nausea, vomiting, bloating, diarrhea, constipation, hematemesis, melena, hematochezia, jaundice or hemorrhoids Genitourinary: Denies dysuria, frequency, urgency, nocturia, hesitancy, discharge, hematuria or flank pain Musculoskeletal: Denies arthralgia, myalgia, stiffness, Jt. Swelling, pain, limp or strain/sprain. Denies Falls. Skin: Denies puritis, rash, hives, warts, acne, eczema or change in skin lesion Neuro: No weakness, tremor, incoordination, spasms, paresthesia or pain Psychiatric: Denies confusion, memory loss or sensory loss. Denies Depression. Endocrine: Denies change in weight, skin, hair change, nocturia, and paresthesia, diabetic polys, visual blurring or hyper / hypo glycemic episodes.  Heme/Lymph: No excessive bleeding, bruising or enlarged lymph nodes.   Physical Exam  There were no vitals taken for this visit.  General Appearance: Well nourished and well groomed and in no apparent distress.  Eyes: PERRLA, EOMs, conjunctiva no swelling or erythema, normal fundi and vessels. Sinuses: No frontal/maxillary tenderness ENT/Mouth: EACs patent / TMs  nl. Nares clear without erythema, swelling, mucoid exudates. Oral hygiene is good. No erythema, swelling, or exudate. Tongue normal, non-obstructing. Tonsils not swollen or erythematous. Hearing normal.  Neck: Supple, thyroid not palpable. No bruits, nodes or JVD. Respiratory: Respiratory effort normal.  BS equal and clear bilateral without rales, rhonci, wheezing or stridor. Cardio: Heart sounds are normal with regular rate and rhythm and no murmurs, rubs or gallops. Peripheral pulses are normal and equal bilaterally without edema. No aortic or femoral bruits. Chest: symmetric with normal excursions and percussion.  Abdomen: Soft, with Nl bowel sounds. Nontender, no guarding, rebound, hernias, masses, or organomegaly.  Lymphatics: Non tender without lymphadenopathy.  Musculoskeletal: Full ROM all peripheral  extremities, joint stability, 5/5 strength, and normal gait. Skin: Warm and dry without rashes, lesions, cyanosis, clubbing or  ecchymosis.  Neuro: Cranial nerves intact, reflexes equal bilaterally. Normal muscle tone, no cerebellar symptoms. Sensation intact.  Pysch: Alert and oriented X 3 with normal affect, insight and judgment appropriate.   Assessment and Plan  1. Annual Preventative/Screening Exam    2. Essential hypertension  - EKG 12-Lead - [place], RETROPERITNL ABD,  LTD - Urinalysis, Routine w reflex microscopic - Microalbumin / creatinine urine ratio - CBC with Differential/Platelet - COMPLETE METABOLIC PANEL WITH GFR - Magnesium - TSH  3. Hyperlipidemia, mixed  - EKG 12-Lead - [place], RETROPERITNL ABD,  LTD - Lipid panel - TSH  4. Abnormal glucose  - EKG 12-Lead - [place], RETROPERITNL ABD,  LTD - Hemoglobin A1c - Insulin, random  5. 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 Screening for colorectal cancer  - POC Hemoccult Bld/Stl   8. Screening for heart  disease  - EKG 12-Lead  9. Prostate cancer screening  - PSA  10. FHx: heart disease  - EKG 12-Lead - [place], RETROPERITNL ABD,  LTD  11. Screening-pulmonary TB  - TB Skin Test  12. Former smoker  - EKG 12-Lead - [place], RETROPERITNL ABD,  LTD  13. Fatigue, unspecified type  - Iron, Total/Total Iron Binding Cap - Vitamin B12 - Testosterone - CBC with Differential/Platelet - TSH  14. Screening for AAA (aortic abdominal aneurysm)  - [place], RETROPERITNL ABD,  LTD  15. Medication management  - Urinalysis, Routine w reflex microscopic - Microalbumin / creatinine urine ratio - CBC with Differential/Platelet - COMPLETE METABOLIC PANEL WITH GFR - Magnesium - Lipid panel - TSH - Hemoglobin A1c - Insulin, random - VITAMIN D 25 Hydroxy           Patient was counseled in prudent diet, weight control to achieve/maintain BMI less  than 25, BP monitoring, regular exercise and medications as discussed.  Discussed med effects and SE's. Routine screening labs and tests as requested with regular follow-up as recommended. Over 40 minutes of exam, counseling, chart review and high complex critical decision making was performed   Marinus Maw, MD

## 2022-12-19 NOTE — Progress Notes (Signed)
Annual  Screening/Preventative Visit  & Comprehensive Evaluation & Examination   Future Appointments  Date Time Provider Department  12/20/2022 10:00 AM Lucky Cowboy, MD [facility]  12/29/2023 10:00 AM Lucky Cowboy, MD [facility]            This very nice 57 y.o. MWM presents for a Screening /Preventative Visit & comprehensive evaluation and management of multiple medical co-morbidities.  Patient has been followed for HTN, HLD, Prediabetes and Vitamin D Deficiency. Patient is on Adderall for ADD with improved focus & concentration.  Patient is also today c/o of a trigger point pain of his left lower lateral neck / shoulder pain.        HTN predates since 2019. Patient's BP has been controlled and today's BP is at goal -  137/80. Patient denies any cardiac symptoms as chest pain, palpitations, shortness of breath, dizziness or ankle swelling.        Patient's hyperlipidemia is controlled with diet . Last lipids were at goal :  Lab Results  Component Value Date   CHOL 147 06/20/2022   HDL 36 (L) 06/20/2022   LDLCALC 94 06/20/2022   TRIG 83 06/20/2022   CHOLHDL 4.1 06/20/2022         Patient is followed expectantly for glucose intolerance and patient denies reactive hypoglycemic symptoms, visual blurring, diabetic polys or paresthesias. Last A1c was normal & at goal :   Lab Results  Component Value Date   HGBA1C 5.1 06/20/2022         Finally, patient has history of Vitamin D Deficiency  and last vitamin D was at goal :   Lab Results  Component Value Date   VD25OH 67 06/20/2022       Current Outpatient Medications on File Prior to Visit  Medication Sig   atenolol 50 MG tablet Take  1 tablet  Daily    ADDERALL 20 MG tablet Take  1/2 to 1 tablet  2 x /day  as needed    VITAMIN C 500 MG  Take 1 capsule  daily.   VITAMIN D 5000 u Take 2 capsules daily.   escitalopram (LEXAPRO) 20 MG tablet Take 1 tablet Daily    Multiple Vitamin  Take 1 tablet  daily.   OTC  iron Takes  1 tablet daily.   zinc 50 MG tablet Take  daily.     Allergies  Allergen Reactions   Penicillins     No past medical history on file.   Health Maintenance  Topic Date Due   HIV Screening  Never done   Hepatitis C Screening  Never done   Zoster Vaccines- Shingrix (1 of 2) Never done   COVID-19 Vaccine (3 - Pfizer risk series) 11/26/2019   INFLUENZA VACCINE  03/08/2022   Fecal DNA (Cologuard)  01/10/2023   TETANUS/TDAP  11/10/2027   HPV VACCINES  Aged Out     Immunization History  Administered Date(s) Administered   Influenza Inj Mdck Quad  06/11/2020   Influenza, Quadrivalent, Recombinant 08/05/2019   Influenza,inj,Quad  06/21/2021   PFIZER-SARS-COV-2 Vacci 10/05/2019, 10/29/2019   PPD Test 12/03/2018, 12/09/2019, 12/08/2020   Tdap 11/09/2017    Cologard - 01/08/2020 - Negative - 3 year f/u due June 2024    No past surgical history on file.   Family History  Problem Relation Age of Onset   Stroke Mother    Heart disease Mother    Stroke Father    Hypertension Father    Heart  disease Father    Hypertension Brother      Social History   Tobacco Use   Smoking status: Former    Types: Cigarettes    Quit date: 10/17/2016    Years since quitting: 5.1   Smokeless tobacco: Never  Substance Use Topics   Alcohol use: Never   Drug use: Never     ROS  Constitutional: Denies fever, chills, weight loss/gain, headaches, insomnia,  night sweats or change in appetite. Does c/o fatigue. Eyes: Denies redness, blurred vision, diplopia, discharge, itchy or watery eyes.  ENT: Denies discharge, congestion, post nasal drip, epistaxis, sore throat, earache, hearing loss, dental pain, Tinnitus, Vertigo, Sinus pain or snoring.  Cardio: Denies chest pain, palpitations, irregular heartbeat, syncope, dyspnea, diaphoresis, orthopnea, PND, claudication or edema Respiratory: denies cough, dyspnea, DOE, pleurisy, hoarseness, laryngitis or wheezing.  Gastrointestinal:  Denies dysphagia, heartburn, reflux, water brash, pain, cramps, nausea, vomiting, bloating, diarrhea, constipation, hematemesis, melena, hematochezia, jaundice or hemorrhoids Genitourinary: Denies dysuria, frequency, urgency, nocturia, hesitancy, discharge, hematuria or flank pain Musculoskeletal: Denies arthralgia, myalgia, stiffness, Jt. Swelling, pain, limp or strain/sprain. Denies Falls. Skin: Denies puritis, rash, hives, warts, acne, eczema or change in skin lesion Neuro: No weakness, tremor, incoordination, spasms, paresthesia or pain Psychiatric: Denies confusion, memory loss or sensory loss. Denies Depression. Endocrine: Denies change in weight, skin, hair change, nocturia, and paresthesia, diabetic polys, visual blurring or hyper / hypo glycemic episodes.  Heme/Lymph: No excessive bleeding, bruising or enlarged lymph nodes.   Physical Exam  BP 137/80   Pulse (!) 57   Temp 98 F (36.7 C)   Resp 16   Ht 5\' 8"  (1.727 m)   Wt 204 lb (92.5 kg)   SpO2 99%   BMI 31.02 kg/m   General Appearance: Well nourished and well groomed and in no apparent distress.  Eyes: PERRLA, EOMs, conjunctiva no swelling or erythema, normal fundi and vessels. Sinuses: No frontal/maxillary tenderness ENT/Mouth: EACs patent / TMs  nl. Nares clear without erythema, swelling, mucoid exudates. Oral hygiene is good. No erythema, swelling, or exudate. Tongue normal, non-obstructing. Tonsils not swollen or erythematous. Hearing normal.  Neck: Supple, thyroid not palpable. No bruits, nodes or JVD. Respiratory: Respiratory effort normal.  BS equal and clear bilateral without rales, rhonci, wheezing or stridor. Cardio: Heart sounds are normal with regular rate and rhythm and no murmurs, rubs or gallops. Peripheral pulses are normal and equal bilaterally without edema. No aortic or femoral bruits. Chest: symmetric with normal excursions and percussion.  Abdomen: Soft, with Nl bowel sounds. Nontender, no guarding,  rebound, hernias, masses, or organomegaly.  Lymphatics: Non tender without lymphadenopathy.  Musculoskeletal: Full ROM all peripheral extremities, joint stability, 5/5 strength, and normal gait.  Procedure ( CPT [phone])    After informed consent & aseptic prep with alcohol , a painful trigger point of the low lateral left neck/shoulder junction was injected with Dexamethasone 10 mg, Torodol 30 mg & Marcaine 0.5%  1 cc with immediate relief & sterile bandaid was applied at injection site.  Skin: Warm and dry without rashes, lesions, cyanosis, clubbing or  ecchymosis.  Neuro: Cranial nerves intact, reflexes equal bilaterally. Normal muscle tone, no cerebellar symptoms. Sensation intact.  Pysch: Alert and oriented X 3 with normal affect, insight and judgment appropriate.   Assessment and Plan  1. Annual Preventative/Screening Exam    2. Essential hypertension  - EKG 12-Lead - [place], RETROPERITNL ABD,  LTD - Urinalysis, Routine w reflex microscopic - Microalbumin / creatinine urine ratio - CBC  with Differential/Platelet - COMPLETE METABOLIC PANEL WITH GFR - Magnesium - TSH  3. Hyperlipidemia, mixed  - EKG 12-Lead - [place], RETROPERITNL ABD,  LTD - Lipid panel - TSH  4. Abnormal glucose  - EKG 12-Lead - [place], RETROPERITNL ABD,  LTD - Hemoglobin A1c - Insulin, random  5. 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 Screening for colorectal cancer  - POC Hemoccult Bld/Stl   8. Screening for heart disease  - EKG 12-Lead  9. Prostate cancer screening  - PSA  10. FHx: heart disease  - EKG 12-Lead - [place], RETROPERITNL ABD,  LTD  11. Screening-pulmonary TB  - TB Skin Test  12. Former smoker  - EKG 12-Lead - [place], RETROPERITNL ABD,  LTD  13. Fatigue, unspecified type  - Iron, Total/Total Iron Binding Cap - Vitamin B12 - Testosterone - CBC with Differential/Platelet - TSH  14. Screening for AAA  (aortic abdominal aneurysm)  - [place], RETROPERITNL ABD,  LTD  15. Medication management  - Urinalysis, Routine w reflex microscopic - Microalbumin / creatinine urine ratio - CBC with Differential/Platelet - COMPLETE METABOLIC PANEL WITH GFR - Magnesium - Lipid panel - TSH - Hemoglobin A1c - Insulin, random - VITAMIN D 25 Hydroxy           Patient was counseled in prudent diet, weight control to achieve/maintain BMI less than 25, BP monitoring, regular exercise and medications as discussed.  Discussed med effects and SE's. Routine screening labs and tests as requested with regular follow-up as recommended. Over 40 minutes of exam, counseling, chart review and high complex critical decision making was performed   Marinus Maw, MD

## 2022-12-20 ENCOUNTER — Encounter: Payer: Self-pay | Admitting: Internal Medicine

## 2022-12-20 ENCOUNTER — Ambulatory Visit (INDEPENDENT_AMBULATORY_CARE_PROVIDER_SITE_OTHER): Payer: BC Managed Care – PPO | Admitting: Internal Medicine

## 2022-12-20 VITALS — BP 137/80 | HR 57 | Temp 98.0°F | Resp 16 | Ht 68.0 in | Wt 204.0 lb

## 2022-12-20 DIAGNOSIS — Z125 Encounter for screening for malignant neoplasm of prostate: Secondary | ICD-10-CM

## 2022-12-20 DIAGNOSIS — Z136 Encounter for screening for cardiovascular disorders: Secondary | ICD-10-CM

## 2022-12-20 DIAGNOSIS — E559 Vitamin D deficiency, unspecified: Secondary | ICD-10-CM

## 2022-12-20 DIAGNOSIS — Z87891 Personal history of nicotine dependence: Secondary | ICD-10-CM

## 2022-12-20 DIAGNOSIS — Z130 Encounter for screening for diseases of the blood and blood-forming organs and certain disorders involving the immune mechanism: Secondary | ICD-10-CM | POA: Diagnosis not present

## 2022-12-20 DIAGNOSIS — N401 Enlarged prostate with lower urinary tract symptoms: Secondary | ICD-10-CM

## 2022-12-20 DIAGNOSIS — E782 Mixed hyperlipidemia: Secondary | ICD-10-CM

## 2022-12-20 DIAGNOSIS — I10 Essential (primary) hypertension: Secondary | ICD-10-CM

## 2022-12-20 DIAGNOSIS — Z79899 Other long term (current) drug therapy: Secondary | ICD-10-CM | POA: Diagnosis not present

## 2022-12-20 DIAGNOSIS — Z111 Encounter for screening for respiratory tuberculosis: Secondary | ICD-10-CM | POA: Diagnosis not present

## 2022-12-20 DIAGNOSIS — I700 Atherosclerosis of aorta: Secondary | ICD-10-CM

## 2022-12-20 DIAGNOSIS — M542 Cervicalgia: Secondary | ICD-10-CM | POA: Diagnosis not present

## 2022-12-20 DIAGNOSIS — Z13220 Encounter for screening for lipoid disorders: Secondary | ICD-10-CM | POA: Diagnosis not present

## 2022-12-20 DIAGNOSIS — Z1389 Encounter for screening for other disorder: Secondary | ICD-10-CM | POA: Diagnosis not present

## 2022-12-20 DIAGNOSIS — Z8249 Family history of ischemic heart disease and other diseases of the circulatory system: Secondary | ICD-10-CM

## 2022-12-20 DIAGNOSIS — Z1211 Encounter for screening for malignant neoplasm of colon: Secondary | ICD-10-CM

## 2022-12-20 DIAGNOSIS — Z1329 Encounter for screening for other suspected endocrine disorder: Secondary | ICD-10-CM | POA: Diagnosis not present

## 2022-12-20 DIAGNOSIS — R7309 Other abnormal glucose: Secondary | ICD-10-CM

## 2022-12-20 DIAGNOSIS — Z0001 Encounter for general adult medical examination with abnormal findings: Secondary | ICD-10-CM

## 2022-12-20 DIAGNOSIS — R350 Frequency of micturition: Secondary | ICD-10-CM | POA: Diagnosis not present

## 2022-12-20 DIAGNOSIS — Z131 Encounter for screening for diabetes mellitus: Secondary | ICD-10-CM | POA: Diagnosis not present

## 2022-12-20 DIAGNOSIS — R5383 Other fatigue: Secondary | ICD-10-CM

## 2022-12-20 DIAGNOSIS — R6889 Other general symptoms and signs: Secondary | ICD-10-CM | POA: Diagnosis not present

## 2022-12-20 DIAGNOSIS — F988 Other specified behavioral and emotional disorders with onset usually occurring in childhood and adolescence: Secondary | ICD-10-CM

## 2022-12-20 LAB — CBC WITH DIFFERENTIAL/PLATELET
Eosinophils Absolute: 41 cells/uL (ref 15–500)
Eosinophils Relative: 1 %
MPV: 9.8 fL (ref 7.5–12.5)
Platelets: 195 10*3/uL (ref 140–400)
RBC: 4.99 10*6/uL (ref 4.20–5.80)
RDW: 13 % (ref 11.0–15.0)

## 2022-12-20 MED ORDER — DEXAMETHASONE 4 MG PO TABS
ORAL_TABLET | ORAL | 0 refills | Status: DC
Start: 2022-12-20 — End: 2023-01-19

## 2022-12-20 NOTE — Patient Instructions (Signed)
Due to recent changes in healthcare laws, you may see the results of your imaging and laboratory studies on MyChart before your provider has had a chance to review them.  We understand that in some cases there may be results that are confusing or concerning to you. Not all laboratory results come back in the same time frame and the provider may be waiting for multiple results in order to interpret others.  Please give us 48 hours in order for your provider to thoroughly review all the results before contacting the office for clarification of your results.   ++++++++++++++++++++++++++++++++++++++  Vit D  & Vit C 1,000 mg   are recommended to help protect  against the Covid-19 and other Corona viruses.    Also it's recommended  to take  Zinc 50 mg  to help  protect against the Covid-19   and best place to get  is also on [employer]  and don't pay more than 6-8 cents /pill !  =============================== Coronavirus (COVID-19) Are you at risk?  Are you at risk for the Coronavirus (COVID-19)?  To be considered HIGH RISK for Coronavirus (COVID-19), you have to meet the following criteria:  Traveled to [place], [place], [place], [place] or [place]; or in the [place] to [place], [place], [place]  or [place]; and have fever, cough, and shortness of breath within the last 2 weeks of travel OR Been in close contact with a person diagnosed with COVID-19 within the last 2 weeks and have  fever, cough,and shortness of breath  IF YOU DO NOT MEET THESE CRITERIA, YOU ARE CONSIDERED LOW RISK FOR COVID-19.  What to do if you are HIGH RISK for COVID-19?  If you are having a medical emergency, call 911. Seek medical care right away. Before you go to a doctor's office, urgent care or emergency department,  call ahead and tell them about your recent travel, contact with someone diagnosed with COVID-19   and your symptoms.  You should receive instructions from your physician's office  regarding next steps of care.  When you arrive at healthcare provider, tell the healthcare staff immediately you have returned from  visiting [place], [place], [place], [place] or [place]; or traveled in the [place] to [place], [place],  [place] or [place] in the last two weeks or you have been in close contact with a person diagnosed with  COVID-19 in the last 2 weeks.   Tell the health care staff about your symptoms: fever, cough and shortness of breath. After you have been seen by a medical provider, you will be either: Tested for (COVID-19) and discharged home on quarantine except to seek medical care if  symptoms worsen, and asked to  Stay home and avoid contact with others until you get your results (4-5 days)  Avoid travel on public transportation if possible (such as bus, train, or airplane) or Sent to the Emergency Department by EMS for evaluation, COVID-19 testing  and  possible admission depending on your condition and test results.  What to do if you are LOW RISK for COVID-19?  Reduce your risk of any infection by using the same precautions used for avoiding the common cold or flu:  Wash your hands often with soap and warm water for at least 20 seconds.  If soap and water are not readily available,  use an alcohol-based hand sanitizer with at least 60% alcohol.  If coughing or sneezing, cover your mouth and nose by coughing   or sneezing into the elbow areas of your shirt or coat,  into a tissue or into your sleeve (not your hands). Avoid shaking hands with others and consider head nods or verbal greetings only. Avoid touching your eyes, nose, or mouth with unwashed hands.  Avoid close contact with people who are sick. Avoid places or events with large numbers of people in one location, like concerts or sporting events. Carefully consider travel plans you have or are making. If you are planning any travel outside or inside the [place], visit the CDC's Travelers' Health  webpage for the latest health notices. If you have some symptoms but not all symptoms, continue to monitor at home and seek medical attention  if your symptoms worsen. If you are having a medical emergency, call 911. >>>>>>>>>>>>>>>>>>>>>>>>>>>> Preventive Care for Adults  A healthy lifestyle and preventive care can promote health and wellness. Preventive health guidelines for men include the following key practices: A routine yearly physical is a good way to check with your health care provider about your health and preventative screening. It is a chance to share any concerns and updates on your health and to receive a thorough exam. Visit your dentist for a routine exam and preventative care every 6 months. Brush your teeth twice a day and floss once a day. Good oral hygiene prevents tooth decay and gum disease. The frequency of eye exams is based on your age, health, family medical history, use of contact lenses, and other factors. Follow your health care provider's recommendations for frequency of eye exams. Eat a healthy diet. Foods such as vegetables, fruits, whole grains, low-fat dairy products, and lean protein foods contain the nutrients you need without too many calories. Decrease your intake of foods high in solid fats, added sugars, and salt. Eat the right amount of calories for you. Get information about a proper diet from your health care provider, if necessary. Regular physical exercise is one of the most important things you can do for your health. Most adults should get at least 150 minutes of moderate-intensity exercise (any activity that increases your heart rate and causes you to sweat) each week. In addition, most adults need muscle-strengthening exercises on 2 or more days a week. Maintain a healthy weight. The body mass index (BMI) is a screening tool to identify possible weight problems. It provides an estimate of body fat based on height and weight. Your health care provider can  find your BMI and can help you achieve or maintain a healthy weight. For adults 20 years and older: A BMI below 18.5 is considered underweight. A BMI of 18.5 to 24.9 is normal. A BMI of 25 to 29.9 is considered overweight. A BMI of 30 and above is considered obese. Maintain normal blood lipids and cholesterol levels by exercising and minimizing your intake of saturated fat. Eat a balanced diet with plenty of fruit and vegetables. Blood tests for lipids and cholesterol should begin at age 20 and be repeated every 5 years. If your lipid or cholesterol levels are high, you are over 50, or you are at high risk for heart disease, you may need your cholesterol levels checked more frequently. Ongoing high lipid and cholesterol levels should be treated with medicines if diet and exercise are not working. If you smoke, find out from your health care provider how to quit. If you do not use tobacco, do not start. Lung cancer screening is recommended for adults aged 55-80 years who are at high risk for   developing lung cancer because of a history of smoking. A yearly low-dose CT scan of the lungs is recommended for people who have at least a 30-pack-year history of smoking and are a current smoker or have quit within the past 15 years. A pack year of smoking is smoking an average of 1 pack of cigarettes a day for 1 year (for example: 1 pack a day for 30 years or 2 packs a day for 15 years). Yearly screening should continue until the smoker has stopped smoking for at least 15 years. Yearly screening should be stopped for people who develop a health problem that would prevent them from having lung cancer treatment. If you choose to drink alcohol, do not have more than 2 drinks per day. One drink is considered to be 12 ounces (355 mL) of beer, 5 ounces (148 mL) of wine, or 1.5 ounces (44 mL) of liquor. Avoid use of street drugs. Do not share needles with anyone. Ask for help if you need support or instructions about  stopping the use of drugs. High blood pressure causes heart disease and increases the risk of stroke. Your blood pressure should be checked at least every 1-2 years. Ongoing high blood pressure should be treated with medicines, if weight loss and exercise are not effective. If you are 45-79 years old, ask your health care provider if you should take aspirin to prevent heart disease. Diabetes screening involves taking a blood sample to check your fasting blood sugar level. This should be done once every 3 years, after age 45, if you are within normal weight and without risk factors for diabetes. Testing should be considered at a younger age or be carried out more frequently if you are overweight and have at least 1 risk factor for diabetes. Colorectal cancer can be detected and often prevented. Most routine colorectal cancer screening begins at the age of 50 and continues through age 75. However, your health care provider may recommend screening at an earlier age if you have risk factors for colon cancer. On a yearly basis, your health care provider may provide home test kits to check for hidden blood in the stool. Use of a small camera at the end of a tube to directly examine the colon (sigmoidoscopy or colonoscopy) can detect the earliest forms of colorectal cancer. Talk to your health care provider about this at age 50, when routine screening begins. Direct exam of the colon should be repeated every 5-10 years through age 75, unless early forms of precancerous polyps or small growths are found.  Talk with your health care provider about prostate cancer screening. Testicular cancer screening isrecommended for adult males. Screening includes self-exam, a health care provider exam, and other screening tests. Consult with your health care provider about any symptoms you have or any concerns you have about testicular cancer. Use sunscreen. Apply sunscreen liberally and repeatedly throughout the day. You should  seek shade when your shadow is shorter than you. Protect yourself by wearing long sleeves, pants, a wide-brimmed hat, and sunglasses year round, whenever you are outdoors. Once a month, do a whole-body skin exam, using a mirror to look at the skin on your back. Tell your health care provider about new moles, moles that have irregular borders, moles that are larger than a pencil eraser, or moles that have changed in shape or color. Stay current with required vaccines (immunizations). Influenza vaccine. All adults should be immunized every year. Tetanus, diphtheria, and acellular pertussis (Td, Tdap) vaccine. An   adult who has not previously received Tdap or who does not know his vaccine status should receive 1 dose of Tdap. This initial dose should be followed by tetanus and diphtheria toxoids (Td) booster doses every 10 years. Adults with an unknown or incomplete history of completing a 3-dose immunization series with Td-containing vaccines should begin or complete a primary immunization series including a Tdap dose. Adults should receive a Td booster every 10 years. Varicella vaccine. An adult without evidence of immunity to varicella should receive 2 doses or a second dose if he has previously received 1 dose. Human papillomavirus (HPV) vaccine. Males aged 13-21 years who have not received the vaccine previously should receive the 3-dose series. Males aged 22-26 years may be immunized. Immunization is recommended through the age of 26 years for any male who has sex with males and did not get any or all doses earlier. Immunization is recommended for any person with an immunocompromised condition through the age of 26 years if he did not get any or all doses earlier. During the 3-dose series, the second dose should be obtained 4-8 weeks after the first dose. The third dose should be obtained 24 weeks after the first dose and 16 weeks after the second dose. Zoster vaccine. One dose is recommended for adults  aged 60 years or older unless certain conditions are present.  PREVNAR  - Pneumococcal 13-valent conjugate (PCV13) vaccine. When indicated, a person who is uncertain of his immunization history and has no record of immunization should receive the PCV13 vaccine. An adult aged 19 years or older who has certain medical conditions and has not been previously immunized should receive 1 dose of PCV13 vaccine. This PCV13 should be followed with a dose of pneumococcal polysaccharide (PPSV23) vaccine. The PPSV23 vaccine dose should be obtained at least 1 r more year(s) after the dose of PCV13 vaccine. An adult aged 19 years or older who has certain medical conditions and previously received 1 or more doses of PPSV23 vaccine should receive 1 dose of PCV13. The PCV13 vaccine dose should be obtained 1 or more years after the last PPSV23 vaccine dose.  PNEUMOVAX - Pneumococcal polysaccharide (PPSV23) vaccine. When PCV13 is also indicated, PCV13 should be obtained first. All adults aged 65 years and older should be immunized. An adult younger than age 65 years who has certain medical conditions should be immunized. Any person who resides in a nursing home or long-term care facility should be immunized. An adult smoker should be immunized. People with an immunocompromised condition and certain other conditions should receive both PCV13 and PPSV23 vaccines. People with human immunodeficiency virus (HIV) infection should be immunized as soon as possible after diagnosis. Immunization during chemotherapy or radiation therapy should be avoided. Routine use of PPSV23 vaccine is not recommended for American Indians, [place] Natives, or people younger than 65 years unless there are medical conditions that require PPSV23 vaccine. When indicated, people who have unknown immunization and have no record of immunization should receive PPSV23 vaccine. One-time revaccination 5 years after the first dose of PPSV23 is recommended for people  aged 19-64 years who have chronic kidney failure, nephrotic syndrome, asplenia, or immunocompromised conditions. People who received 1-2 doses of PPSV23 before age 65 years should receive another dose of PPSV23 vaccine at age 65 years or later if at least 5 years have passed since the previous dose. Doses of PPSV23 are not needed for people immunized with PPSV23 at or after age 65 years.  Hepatitis A vaccine.   Adults who wish to be protected from this disease, have certain high-risk conditions, work with hepatitis A-infected animals, work in hepatitis A research labs, or travel to or work in countries with a high rate of hepatitis A should be immunized. Adults who were previously unvaccinated and who anticipate close contact with an international adoptee during the first 60 days after arrival in the [place] from a country with a high rate of hepatitis A should be immunized.  Hepatitis B vaccine. Adults should be immunized if they wish to be protected from this disease, have certain high-risk conditions, may be exposed to blood or other infectious body fluids, are household contacts or sex partners of hepatitis B positive people, are clients or workers in certain care facilities, or travel to or work in countries with a high rate of hepatitis B.  Preventive Service / Frequency  Ages 40 to 64 Blood pressure check. Lipid and cholesterol check Lung cancer screening. / Every year if you are aged 55-80 years and have a 30-pack-year history of smoking and currently smoke or have quit within the past 15 years. Yearly screening is stopped once you have quit smoking for at least 15 years or develop a health problem that would prevent you from having lung cancer treatment. Fecal occult blood test (FOBT) of stool. / Every year beginning at age 50 and continuing until age 75. You may not have to do this test if you get a colonoscopy every 10 years. Flexible sigmoidoscopy** or colonoscopy.** / Every 5 years for  a flexible sigmoidoscopy or every 10 years for a colonoscopy beginning at age 50 and continuing until age 75. Screening for abdominal aortic aneurysm (AAA)  by ultrasound is recommended for people who have history of high blood pressure or who are current or former smokers. +++++++++++ Recommend Adult Low Dose Aspirin or  coated  Aspirin 81 mg daily  To reduce risk of Colon Cancer 40 %,  Skin Cancer 26 % ,  Malignant Melanoma 46%  and  Pancreatic cancer 60% ++++++++++++++++++++ Vitamin D goal  is between 70-100.  Please make sure that you are taking your Vitamin D as directed.  It is very important as a natural anti-inflammatory  helping hair, skin, and nails, as well as reducing stroke and heart attack risk.  It helps your bones and helps with mood. It also decreases numerous cancer risks so please take it as directed.  Low Vit D is associated with a 200-300% higher risk for CANCER  and 200-300% higher risk for HEART   ATTACK  &  STROKE.   ...................................... It is also associated with higher death rate at younger ages,  autoimmune diseases like Rheumatoid arthritis, Lupus, Multiple Sclerosis.    Also many other serious conditions, like depression, Alzheimer's Dementia, infertility, muscle aches, fatigue, fibromyalgia - just to name a few. +++++++++++++++++++++ Recommend the book "The END of DIETING" by Dr Joel Fuhrman  & the book "The END of DIABETES " by Dr Joel Fuhrman At [employer] - get book & Audio CD's    Being diabetic has a  300% increased risk for heart attack, stroke, cancer, and alzheimer- type vascular dementia. It is very important that you work harder with diet by avoiding all foods that are white. Avoid white rice (brown & wild rice is OK), white potatoes (sweetpotatoes in moderation is OK), White bread or wheat bread or anything made out of white flour like bagels, donuts, rolls, buns, biscuits, cakes, pastries, cookies, pizza crust, and pasta (made    from white flour & egg whites) - vegetarian pasta or spinach or wheat pasta is OK. Multigrain breads like Arnold's or [employer], or multigrain sandwich thins or flatbreads.  Diet, exercise and weight loss can reverse and cure diabetes in the early stages.  Diet, exercise and weight loss is very important in the control and prevention of complications of diabetes which affects every system in your body, ie. Brain - dementia/stroke, eyes - glaucoma/blindness, heart - heart attack/heart failure, kidneys - dialysis, stomach - gastric paralysis, intestines - malabsorption, nerves - severe painful neuritis, circulation - gangrene & loss of a leg(s), and finally cancer and Alzheimers.    I recommend avoid fried & greasy foods,  sweets/candy, white rice (brown or wild rice or Quinoa is OK), white potatoes (sweet potatoes are OK) - anything made from white flour - bagels, doughnuts, rolls, buns, biscuits,white and wheat breads, pizza crust and traditional pasta made of white flour & egg white(vegetarian pasta or spinach or wheat pasta is OK).  Multi-grain bread is OK - like multi-grain flat bread or sandwich thins. Avoid alcohol in excess. Exercise is also important.    Eat all the vegetables you want - avoid meat, especially red meat and dairy - especially cheese.  Cheese is the most concentrated form of trans-fats which is the worst thing to clog up our arteries. Veggie cheese is OK which can be found in the fresh produce section at [employer] or [employer] or [employer]  ++++++++++++++++++++++ DASH Eating Plan  DASH stands for "Dietary Approaches to Stop Hypertension."   The DASH eating plan is a healthy eating plan that has been shown to reduce high blood pressure (hypertension). Additional health benefits may include reducing the risk of type 2 diabetes mellitus, heart disease, and stroke. The DASH eating plan may also help with weight loss. WHAT DO I NEED TO KNOW ABOUT THE DASH EATING PLAN? For  the DASH eating plan, you will follow these general guidelines: Choose foods with a percent daily value for sodium of less than 5% (as listed on the food label). Use salt-free seasonings or herbs instead of table salt or sea salt. Check with your health care provider or pharmacist before using salt substitutes. Eat lower-sodium products, often labeled as "lower sodium" or "no salt added." Eat fresh foods. Eat more vegetables, fruits, and low-fat dairy products. Choose whole grains. Look for the word "whole" as the first word in the ingredient list. Choose fish  Limit sweets, desserts, sugars, and sugary drinks. Choose heart-healthy fats. Eat veggie cheese  Eat more home-cooked food and less restaurant, buffet, and fast food. Limit fried foods. Cook foods using methods other than frying. Limit canned vegetables. If you do use them, rinse them well to decrease the sodium. When eating at a restaurant, ask that your food be prepared with less salt, or no salt if possible.                      WHAT FOODS CAN I EAT? Read Dr Joel Fuhrman's books on The End of Dieting & The End of Diabetes  Grains Whole grain or whole wheat bread. Brown rice. Whole grain or whole wheat pasta. Quinoa, bulgur, and whole grain cereals. Low-sodium cereals. Corn or whole wheat flour tortillas. Whole grain cornbread. Whole grain crackers. Low-sodium crackers.  Vegetables Fresh or frozen vegetables (raw, steamed, roasted, or grilled). Low-sodium or reduced-sodium tomato and vegetable juices. Low-sodium or reduced-sodium tomato sauce and paste. Low-sodium or reduced-sodium canned vegetables.     Fruits All fresh, canned (in natural juice), or frozen fruits.  Protein Products  All fish and seafood.  Dried beans, peas, or lentils. Unsalted nuts and seeds. Unsalted canned beans.  Dairy Low-fat dairy products, such as skim or 1% milk, 2% or reduced-fat cheeses, low-fat ricotta or cottage cheese, or plain low-fat yogurt.  Low-sodium or reduced-sodium cheeses.  Fats and Oils Tub margarines without trans fats. Light or reduced-fat mayonnaise and salad dressings (reduced sodium). Avocado. Safflower, olive, or canola oils. Natural peanut or almond butter.  Other Unsalted popcorn and pretzels. The items listed above may not be a complete list of recommended foods or beverages. Contact your dietitian for more options.  +++++++++++++++++++  WHAT FOODS ARE NOT RECOMMENDED? Grains/ White flour or wheat flour White bread. White pasta. White rice. Refined cornbread. Bagels and croissants. Crackers that contain trans fat.  Vegetables  Creamed or fried vegetables. Vegetables in a . Regular canned vegetables. Regular canned tomato sauce and paste. Regular tomato and vegetable juices.  Fruits Dried fruits. Canned fruit in light or heavy syrup. Fruit juice.  Meat and Other Protein Products Meat in general - RED meat & White meat.  Fatty cuts of meat. Ribs, chicken wings, all processed meats as bacon, sausage, bologna, salami, fatback, hot dogs, bratwurst and packaged luncheon meats.  Dairy Whole or 2% milk, cream, half-and-half, and cream cheese. Whole-fat or sweetened yogurt. Full-fat cheeses or blue cheese. Non-dairy creamers and whipped toppings. Processed cheese, cheese spreads, or cheese curds.  Condiments Onion and garlic salt, seasoned salt, table salt, and sea salt. Canned and packaged gravies. Worcestershire sauce. Tartar sauce. Barbecue sauce. Teriyaki sauce. Soy sauce, including reduced sodium. Steak sauce. Fish sauce. Oyster sauce. Cocktail sauce. Horseradish. Ketchup and mustard. Meat flavorings and tenderizers. Bouillon cubes. Hot sauce. Tabasco sauce. Marinades. Taco seasonings. Relishes.  Fats and Oils Butter, stick margarine, lard, shortening and bacon fat. Coconut, palm kernel, or palm oils. Regular salad dressings.  Pickles and olives. Salted popcorn and pretzels.  The items listed above may not  be a complete list of foods and beverages to avoid.   

## 2022-12-21 LAB — COMPLETE METABOLIC PANEL WITH GFR
AG Ratio: 1.8 (calc) (ref 1.0–2.5)
ALT: 21 U/L (ref 9–46)
AST: 19 U/L (ref 10–35)
Albumin: 4.6 g/dL (ref 3.6–5.1)
Alkaline phosphatase (APISO): 93 U/L (ref 35–144)
BUN: 22 mg/dL (ref 7–25)
CO2: 27 mmol/L (ref 20–32)
Calcium: 9.8 mg/dL (ref 8.6–10.3)
Chloride: 104 mmol/L (ref 98–110)
Creat: 0.93 mg/dL (ref 0.70–1.30)
Globulin: 2.5 g/dL (calc) (ref 1.9–3.7)
Glucose, Bld: 92 mg/dL (ref 65–99)
Potassium: 4.9 mmol/L (ref 3.5–5.3)
Sodium: 140 mmol/L (ref 135–146)
Total Bilirubin: 0.3 mg/dL (ref 0.2–1.2)
Total Protein: 7.1 g/dL (ref 6.1–8.1)
eGFR: 96 mL/min/{1.73_m2} (ref >=60)

## 2022-12-21 LAB — URINALYSIS, ROUTINE W REFLEX MICROSCOPIC
Bilirubin Urine: NEGATIVE
Glucose, UA: NEGATIVE
Hgb urine dipstick: NEGATIVE
Ketones, ur: NEGATIVE
Leukocytes,Ua: NEGATIVE
Nitrite: NEGATIVE
Protein, ur: NEGATIVE
Specific Gravity, Urine: 1.014 (ref 1.001–1.035)
pH: 6 (ref 5.0–8.0)

## 2022-12-21 LAB — IRON, TOTAL/TOTAL IRON BINDING CAP
%SAT: 23 % (calc) (ref 20–48)
Iron: 69 ug/dL (ref 50–180)
TIBC: 295 mcg/dL (calc) (ref 250–425)

## 2022-12-21 LAB — HEMOGLOBIN A1C
Hgb A1c MFr Bld: 5 % of total Hgb (ref <5.7)
Mean Plasma Glucose: 97 mg/dL
eAG (mmol/L): 5.4 mmol/L

## 2022-12-21 LAB — PSA: PSA: 0.5 ng/mL (ref <=4.00)

## 2022-12-21 LAB — VITAMIN B12: Vitamin B-12: 550 pg/mL (ref 200–1100)

## 2022-12-21 LAB — CBC WITH DIFFERENTIAL/PLATELET
Absolute Monocytes: 287 cells/uL (ref 200–950)
Basophils Absolute: 29 cells/uL (ref 0–200)
Basophils Relative: 0.7 %
HCT: 43.2 % (ref 38.5–50.0)
Hemoglobin: 14.4 g/dL (ref 13.2–17.1)
Lymphs Abs: 1140 cells/uL (ref 850–3900)
MCH: 28.9 pg (ref 27.0–33.0)
MCHC: 33.3 g/dL (ref 32.0–36.0)
MCV: 86.6 fL (ref 80.0–100.0)
Monocytes Relative: 7 %
Neutro Abs: 2604 cells/uL (ref 1500–7800)
Neutrophils Relative %: 63.5 %
Total Lymphocyte: 27.8 %
WBC: 4.1 10*3/uL (ref 3.8–10.8)

## 2022-12-21 LAB — MICROALBUMIN / CREATININE URINE RATIO
Creatinine, Urine: 60 mg/dL (ref 20–320)
Microalb Creat Ratio: 3 mg/g creat (ref <30)
Microalb, Ur: 0.2 mg/dL

## 2022-12-21 LAB — LIPID PANEL
Cholesterol: 150 mg/dL (ref <200)
HDL: 42 mg/dL (ref >=40)
LDL Cholesterol (Calc): 91 mg/dL (calc)
Non-HDL Cholesterol (Calc): 108 mg/dL (calc) (ref <130)
Total CHOL/HDL Ratio: 3.6 (calc) (ref <5.0)
Triglycerides: 76 mg/dL (ref <150)

## 2022-12-21 LAB — VITAMIN D 25 HYDROXY (VIT D DEFICIENCY, FRACTURES): Vit D, 25-Hydroxy: 65 ng/mL (ref 30–100)

## 2022-12-21 LAB — MAGNESIUM: Magnesium: 2.2 mg/dL (ref 1.5–2.5)

## 2022-12-21 LAB — INSULIN, RANDOM: Insulin: 11.2 u[IU]/mL

## 2022-12-21 LAB — TESTOSTERONE: Testosterone: 360 ng/dL (ref 250–827)

## 2022-12-21 LAB — TSH: TSH: 0.86 mIU/L (ref 0.40–4.50)

## 2023-01-19 ENCOUNTER — Other Ambulatory Visit: Payer: Self-pay | Admitting: Internal Medicine

## 2023-01-19 DIAGNOSIS — F900 Attention-deficit hyperactivity disorder, predominantly inattentive type: Secondary | ICD-10-CM

## 2023-01-19 MED ORDER — AMPHETAMINE-DEXTROAMPHETAMINE 20 MG PO TABS
ORAL_TABLET | ORAL | 0 refills | Status: DC
Start: 2023-01-19 — End: 2023-03-20

## 2023-01-20 ENCOUNTER — Other Ambulatory Visit: Payer: Self-pay

## 2023-01-20 DIAGNOSIS — F419 Anxiety disorder, unspecified: Secondary | ICD-10-CM

## 2023-01-20 MED ORDER — ESCITALOPRAM OXALATE 20 MG PO TABS
ORAL_TABLET | ORAL | 3 refills | Status: DC
Start: 2023-01-20 — End: 2024-03-11

## 2023-03-20 ENCOUNTER — Other Ambulatory Visit: Payer: Self-pay | Admitting: Internal Medicine

## 2023-03-20 DIAGNOSIS — F900 Attention-deficit hyperactivity disorder, predominantly inattentive type: Secondary | ICD-10-CM

## 2023-03-20 MED ORDER — AMPHETAMINE-DEXTROAMPHETAMINE 20 MG PO TABS
ORAL_TABLET | ORAL | 0 refills | Status: DC
Start: 2023-03-20 — End: 2023-05-01

## 2023-03-23 ENCOUNTER — Ambulatory Visit (INDEPENDENT_AMBULATORY_CARE_PROVIDER_SITE_OTHER): Payer: BC Managed Care – PPO | Admitting: Internal Medicine

## 2023-03-23 ENCOUNTER — Encounter: Payer: Self-pay | Admitting: Internal Medicine

## 2023-03-23 VITALS — BP 102/72 | HR 80 | Temp 97.5°F | Resp 16 | Ht 68.0 in | Wt 206.0 lb

## 2023-03-23 DIAGNOSIS — M542 Cervicalgia: Secondary | ICD-10-CM | POA: Diagnosis not present

## 2023-03-23 NOTE — Progress Notes (Signed)
     Future Appointments  Date Time Provider Department  03/23/2023  4:00 PM Lucky Cowboy, MD [facility]  06/26/2023  9:30 AM Adela Glimpse, NP [facility]  12/29/2023 10:00 AM Lucky Cowboy, MD [facility]    History of Present Illness:      This very nice 57 y.o. MWM with  HTN, HLD, Prediabetes and Vitamin D Deficiency presents with    c/o  pains at both lateral sides of neck.    Current Outpatient Medications on File Prior to Visit  Medication Sig   ADDERALL  20 MG tablet Take  1/2 to 1 tablet  2 x /day  as needed for Focus & Concentration   VITAMIN C 500 MG CAPS Take 1 capsule by mouth daily.   atenolol  50 MG tablet TAKE 1 TABLET BY MOUTH EVERY DAY FOR BLOOD PRESSURE   VITAMIN D  5000 u Take 2 capsules by mouth daily.   escitalopram (LEXAPRO) 20 MG tablet TAKE 1 TABLET BY MOUTH DAILY FOR MOOD   Multiple Vitamin  Take 1 tablet by mouth daily.   OTC iron  Takes 1 tablet daily.   zinc gluconate 50 MG tablet Take 50 mg by mouth daily.     Allergies  Allergen Reactions   Penicillins     Problem list He has Essential hypertension; Vitamin D deficiency; GERD (gastroesophageal reflux disease); Former smoker; FHx: heart disease; Anxiety tension state; Obesity (BMI 30.0-34.9); and Attention deficit disorder (ADD) - inattention on their problem list.   Observations/Objective:  BP 102/72   Pulse 80   Temp (!) 97.5 F (36.4 C)   Resp 16   Ht 5\' 8"  (1.727 m)   Wt 206 lb (93.4 kg)   SpO2 93%   BMI 31.32 kg/m   Trigger points are identified on both Right & Left lateral  neck.  Procedure ( CPT [id number])                          After informed consent & aseptic prep with alcohol, painful trigger points  of the low lateral  Right & Left neck/shoulder junction (s)  were identified & each  injected with Dexamethasone 10 mg & Marcaine 0.5%  1 cc with immediate relief & sterile bandaids  were applied at injection sites.   Assessment and Plan:   1. Cervical pain  (neck)   Follow Up Instructions:        I discussed the assessment and treatment plan with the patient. The patient was provided an opportunity to ask questions and all were answered. The patient agreed with the plan and demonstrated an understanding of the instructions.       The patient was advised to call back or seek an in-person evaluation if the symptoms worsen or if the condition fails to improve as anticipated.    Marinus Maw, MD

## 2023-05-01 ENCOUNTER — Other Ambulatory Visit: Payer: Self-pay | Admitting: Internal Medicine

## 2023-05-01 DIAGNOSIS — F900 Attention-deficit hyperactivity disorder, predominantly inattentive type: Secondary | ICD-10-CM

## 2023-05-01 MED ORDER — AMPHETAMINE-DEXTROAMPHETAMINE 20 MG PO TABS
ORAL_TABLET | ORAL | 0 refills | Status: DC
Start: 2023-05-01 — End: 2023-06-06

## 2023-06-06 ENCOUNTER — Other Ambulatory Visit: Payer: Self-pay | Admitting: Internal Medicine

## 2023-06-06 ENCOUNTER — Other Ambulatory Visit: Payer: Self-pay | Admitting: Nurse Practitioner

## 2023-06-06 DIAGNOSIS — I10 Essential (primary) hypertension: Secondary | ICD-10-CM

## 2023-06-06 DIAGNOSIS — F900 Attention-deficit hyperactivity disorder, predominantly inattentive type: Secondary | ICD-10-CM

## 2023-06-06 MED ORDER — AMPHETAMINE-DEXTROAMPHETAMINE 20 MG PO TABS
ORAL_TABLET | ORAL | 0 refills | Status: DC
Start: 2023-06-06 — End: 2023-07-21

## 2023-06-26 ENCOUNTER — Encounter: Payer: Self-pay | Admitting: Nurse Practitioner

## 2023-06-26 ENCOUNTER — Ambulatory Visit (INDEPENDENT_AMBULATORY_CARE_PROVIDER_SITE_OTHER): Payer: BC Managed Care – PPO | Admitting: Nurse Practitioner

## 2023-06-26 VITALS — BP 122/74 | HR 60 | Temp 97.6°F | Ht 68.0 in | Wt 208.0 lb

## 2023-06-26 DIAGNOSIS — I10 Essential (primary) hypertension: Secondary | ICD-10-CM

## 2023-06-26 DIAGNOSIS — E66811 Obesity, class 1: Secondary | ICD-10-CM

## 2023-06-26 DIAGNOSIS — F902 Attention-deficit hyperactivity disorder, combined type: Secondary | ICD-10-CM

## 2023-06-26 DIAGNOSIS — E559 Vitamin D deficiency, unspecified: Secondary | ICD-10-CM

## 2023-06-26 DIAGNOSIS — R7309 Other abnormal glucose: Secondary | ICD-10-CM | POA: Diagnosis not present

## 2023-06-26 DIAGNOSIS — R239 Unspecified skin changes: Secondary | ICD-10-CM | POA: Diagnosis not present

## 2023-06-26 DIAGNOSIS — E782 Mixed hyperlipidemia: Secondary | ICD-10-CM | POA: Diagnosis not present

## 2023-06-26 DIAGNOSIS — Z79899 Other long term (current) drug therapy: Secondary | ICD-10-CM

## 2023-06-26 DIAGNOSIS — Z23 Encounter for immunization: Secondary | ICD-10-CM

## 2023-06-26 MED ORDER — TRIAMCINOLONE ACETONIDE 0.025 % EX OINT: 1.0000 | TOPICAL_OINTMENT | Freq: Two times a day (BID) | CUTANEOUS | 0 refills | Status: DC

## 2023-06-26 NOTE — Progress Notes (Signed)
FOLLOW UP  Assessment and Plan:   Hypertension Well controlled with current medications  Monitor blood pressure at home; patient to call if consistently greater than 130/80 Continue DASH diet.   Reminder to go to the ER if any CP, SOB, nausea, dizziness, severe HA, changes vision/speech, left arm numbness and tingling and jaw pain.  Cholesterol Currently at goal;  Continue low cholesterol diet and exercise.  Check lipid panel.   Abnormal Glucose  Education: Reviewed 'ABCs' of diabetes management  A1C (<7) Blood pressure (<130/80) Cholesterol (LDL <70) Continue Eye Exam yearly  Continue Dental Exam Q6 mo Discussed dietary recommendations Discussed Physical Activity recommendations Check A1C   Obesity with co morbidities Discussed appropriate BMI Diet modification. Physical activity. Encouraged/praised to build confidence.  Vitamin D Def At goal at last visit; continue supplementation to maintain goal of 60-100 Monitor Vitamin D levels  ADHD Well managed. Continue medication as directed.  GERD No suspected reflux complications (Barret/stricture). Lifestyle modification:  wt loss, avoid meals 2-3h before bedtime. Consider eliminating food triggers:  chocolate, caffeine, EtOH, acid/spicy food.  Skin change (face) Start triamcinolone x2 weeks - discussed not using greater than two weeks to reduce hypopigmentation of skin.   Continue to monitor    Orders Placed This Encounter  Procedures   Flu vaccine HIGH DOSE PF(Fluzone Trivalent)   CBC with Differential/Platelet   COMPLETE METABOLIC PANEL WITH GFR   Lipid panel   Hemoglobin A1c   Meds ordered this encounter  Medications   triamcinolone (KENALOG) 0.025 % ointment    Sig: Apply 1 Application topically 2 (two) times daily.    Dispense:  30 g    Refill:  0    Order Specific Question:   Supervising Provider    Answer:   Lucky Cowboy [phone]    Notify office for further evaluation and treatment, questions  or concerns if any reported s/s fail to improve.   The patient was advised to call back or seek an in-person evaluation if any symptoms worsen or if the condition fails to improve as anticipated.   Further disposition pending results of labs. Discussed med's effects and SE's.    I discussed the assessment and treatment plan with the patient. The patient was provided an opportunity to ask questions and all were answered. The patient agreed with the plan and demonstrated an understanding of the instructions.  Discussed med's effects and SE's. Screening labs and tests as requested with regular follow-up as recommended.  I provided 30 minutes of face-to-face time during this encounter including counseling, chart review, and critical decision making was preformed.  Today's Plan of Care is based on a patient-centered health care approach known as shared decision making - the decisions, tests and treatments allow for patient preferences and values to be balanced with clinical evidence.     Future Appointments  Date Time Provider Department Center  12/29/2023 10:00 AM Lucky Cowboy, MD [facility] None    ----------------------------------------------------------------------------------------------------------------------  HPI 57 y.o. male  presents for 3 month follow up on hypertension, cholesterol, diabetes, weight and vitamin D deficiency.   Overall he reports feeling well.    He has noticed an area on the skin located under the left eye that is a brow discoloration.  Present for two weeks.  Prior to the brown spot appearing he reports scratching off a pimple that would come and go.  He his placed Neosporin on the area without change.  He does report going to the tanning bed twice weekly.  He continues Adderall 20 mg PRN for focus and concentration.  Medication is currently effective.  His GERD is managed by avoiding triggers.  Takes OTC H2 blocker PRN.   BMI is Body mass index is  31.63 kg/m., he has been working on diet and exercise. Wt Readings from Last 3 Encounters:  06/26/23 208 lb (94.3 kg)  03/23/23 206 lb (93.4 kg)  12/20/22 204 lb (92.5 kg)    His blood pressure has been controlled at home, today their BP is BP: 122/74  He does not workout. He denies chest pain, shortness of breath, dizziness.   He is not on cholesterol medication. His cholesterol is at goal. The cholesterol last visit was:   Lab Results  Component Value Date   CHOL 150 12/20/2022   HDL 42 12/20/2022   LDLCALC 91 12/20/2022   TRIG 76 12/20/2022   CHOLHDL 3.6 12/20/2022    He has been working on diet and exercise for prediabetes, and denies polydipsia and polyuria. Last A1C in the office was:  Lab Results  Component Value Date   HGBA1C 5.0 12/20/2022   Patient is on Vitamin D supplement.   Lab Results  Component Value Date   VD25OH 21 12/20/2022        Current Medications:  Current Outpatient Medications on File Prior to Visit  Medication Sig   amphetamine-dextroamphetamine (ADDERALL) 20 MG tablet Take  1/2 to 1 tablet  2 x / day  as needed for ADD, Focus & Concentration   Ascorbic Acid (VITAMIN C) 500 MG CAPS Take 1 capsule by mouth daily.   atenolol (TENORMIN) 50 MG tablet TAKE 1 TABLET BY MOUTH EVERY DAY FOR BLOOD PRESSURE   Cholecalciferol (VITAMIN D) 125 MCG (5000 UT) CAPS Take 2 capsules by mouth daily.   diclofenac (VOLTAREN) 75 MG EC tablet Take 75 mg by mouth 2 (two) times daily as needed.   escitalopram (LEXAPRO) 20 MG tablet TAKE 1 TABLET BY MOUTH DAILY FOR MOOD   Multiple Vitamin (MULTIVITAMIN) tablet Take 1 tablet by mouth daily.   OVER THE COUNTER MEDICATION Takes OTC iron 1 tablet daily.   pregabalin (LYRICA) 75 MG capsule Take by mouth.   zinc gluconate 50 MG tablet Take 50 mg by mouth daily.   No current facility-administered medications on file prior to visit.     Allergies:  Allergies  Allergen Reactions   Penicillins      Medical History:  History reviewed. No pertinent past medical history. Family history- Reviewed and unchanged Social history- Reviewed and unchanged   Review of Systems: A complete ROS was performed with pertinent positives/negatives noted in the HPI. The remainder of the ROS are negative.  Physical Exam: BP 122/74   Pulse 60   Temp 97.6 F (36.4 C)   Ht 5\' 8"  (1.727 m)   Wt 208 lb (94.3 kg)   SpO2 98%   BMI 31.63 kg/m  Wt Readings from Last 3 Encounters:  06/26/23 208 lb (94.3 kg)  03/23/23 206 lb (93.4 kg)  12/20/22 204 lb (92.5 kg)   General Appearance: Well nourished, in no apparent distress. Eyes: PERRLA, EOMs, conjunctiva no swelling or erythema Sinuses: No Frontal/maxillary tenderness ENT/Mouth: Ext aud canals clear, TMs without erythema, bulging. No erythema, swelling, or exudate on post pharynx.  Tonsils not swollen or erythematous. Hearing normal.  Neck: Supple, thyroid normal.  Respiratory: Respiratory effort normal, BS equal bilaterally without rales, rhonchi, wheezing or stridor.  Cardio: RRR with no MRGs. Brisk peripheral pulses without edema.  Abdomen: Soft, + BS.  Non tender, no guarding, rebound, hernias, masses. Lymphatics: Non tender without lymphadenopathy.  Musculoskeletal: Full ROM, 5/5 strength, Normal gait Skin: Left eye, just below lateral canthus with flat round light brown circular skin change.  Approximately 0.5 cm round.  Surrounding skin warm, dry without rashes, lesions, ecchymosis.  Neuro: Cranial nerves intact. No cerebellar symptoms.  Psych: Awake and oriented X 3, normal affect, Insight and Judgment appropriate.    Adela Glimpse, NP 10:59 AM [facility]

## 2023-06-26 NOTE — Patient Instructions (Signed)

## 2023-06-27 LAB — LIPID PANEL
Cholesterol: 163 mg/dL (ref <200)
HDL: 43 mg/dL (ref >=40)
LDL Cholesterol (Calc): 104 mg/dL — ABNORMAL HIGH
Non-HDL Cholesterol (Calc): 120 mg/dL (ref <130)
Total CHOL/HDL Ratio: 3.8 (calc) (ref <5.0)
Triglycerides: 75 mg/dL (ref <150)

## 2023-06-27 LAB — CBC WITH DIFFERENTIAL/PLATELET
Absolute Lymphocytes: 1352 {cells}/uL (ref 850–3900)
Absolute Monocytes: 353 {cells}/uL (ref 200–950)
Basophils Absolute: 42 {cells}/uL (ref 0–200)
Basophils Relative: 1 %
Eosinophils Absolute: 59 {cells}/uL (ref 15–500)
Eosinophils Relative: 1.4 %
HCT: 47 % (ref 38.5–50.0)
Hemoglobin: 15.6 g/dL (ref 13.2–17.1)
MCH: 29.7 pg (ref 27.0–33.0)
MCHC: 33.2 g/dL (ref 32.0–36.0)
MCV: 89.5 fL (ref 80.0–100.0)
MPV: 9.5 fL (ref 7.5–12.5)
Monocytes Relative: 8.4 %
Neutro Abs: 2394 {cells}/uL (ref 1500–7800)
Neutrophils Relative %: 57 %
Platelets: 204 10*3/uL (ref 140–400)
RBC: 5.25 10*6/uL (ref 4.20–5.80)
RDW: 12.3 % (ref 11.0–15.0)
Total Lymphocyte: 32.2 %
WBC: 4.2 10*3/uL (ref 3.8–10.8)

## 2023-06-27 LAB — COMPLETE METABOLIC PANEL WITH GFR
AG Ratio: 2.1 (calc) (ref 1.0–2.5)
ALT: 27 U/L (ref 9–46)
AST: 20 U/L (ref 10–35)
Albumin: 4.8 g/dL (ref 3.6–5.1)
Alkaline phosphatase (APISO): 79 U/L (ref 35–144)
BUN: 21 mg/dL (ref 7–25)
CO2: 27 mmol/L (ref 20–32)
Calcium: 9.9 mg/dL (ref 8.6–10.3)
Chloride: 104 mmol/L (ref 98–110)
Creat: 0.94 mg/dL (ref 0.70–1.30)
Globulin: 2.3 g/dL (ref 1.9–3.7)
Glucose, Bld: 88 mg/dL (ref 65–99)
Potassium: 4.7 mmol/L (ref 3.5–5.3)
Sodium: 138 mmol/L (ref 135–146)
Total Bilirubin: 0.4 mg/dL (ref 0.2–1.2)
Total Protein: 7.1 g/dL (ref 6.1–8.1)
eGFR: 94 mL/min/{1.73_m2} (ref >=60)

## 2023-06-27 LAB — HEMOGLOBIN A1C
Hgb A1c MFr Bld: 5.2 %{Hb} (ref <5.7)
Mean Plasma Glucose: 103 mg/dL
eAG (mmol/L): 5.7 mmol/L

## 2023-07-21 ENCOUNTER — Other Ambulatory Visit: Payer: Self-pay | Admitting: Internal Medicine

## 2023-07-21 DIAGNOSIS — F900 Attention-deficit hyperactivity disorder, predominantly inattentive type: Secondary | ICD-10-CM

## 2023-07-21 MED ORDER — AMPHETAMINE-DEXTROAMPHETAMINE 20 MG PO TABS: ORAL_TABLET | ORAL | 0 refills | Status: DC

## 2023-08-08 ENCOUNTER — Other Ambulatory Visit: Payer: Self-pay | Admitting: Nurse Practitioner

## 2023-08-08 DIAGNOSIS — R239 Unspecified skin changes: Secondary | ICD-10-CM

## 2023-09-05 ENCOUNTER — Telehealth: Payer: Self-pay | Admitting: Nurse Practitioner

## 2023-09-05 NOTE — Telephone Encounter (Signed)
Patient is requesting a refill on [facility]der[facility] to [facility] [place]

## 2023-09-06 ENCOUNTER — Other Ambulatory Visit: Payer: Self-pay | Admitting: Nurse Practitioner

## 2023-09-06 DIAGNOSIS — F900 Attention-deficit hyperactivity disorder, predominantly inattentive type: Secondary | ICD-10-CM

## 2023-09-06 MED ORDER — AMPHETAMINE-DEXTROAMPHETAMINE 20 MG PO TABS: ORAL_TABLET | ORAL | 0 refills | Status: DC

## 2023-09-19 ENCOUNTER — Encounter: Payer: Self-pay | Admitting: *Deleted

## 2023-10-20 ENCOUNTER — Other Ambulatory Visit: Payer: Self-pay

## 2023-10-20 DIAGNOSIS — F900 Attention-deficit hyperactivity disorder, predominantly inattentive type: Secondary | ICD-10-CM

## 2023-10-20 MED ORDER — AMPHETAMINE-DEXTROAMPHETAMINE 20 MG PO TABS: ORAL_TABLET | ORAL | 0 refills | Status: DC

## 2023-11-20 ENCOUNTER — Ambulatory Visit (INDEPENDENT_AMBULATORY_CARE_PROVIDER_SITE_OTHER): Payer: Self-pay | Admitting: Nurse Practitioner

## 2023-11-20 ENCOUNTER — Encounter: Payer: Self-pay | Admitting: Nurse Practitioner

## 2023-11-20 VITALS — BP 110/70 | HR 78 | Temp 98.0°F | Ht 67.45 in | Wt 210.2 lb

## 2023-11-20 DIAGNOSIS — F419 Anxiety disorder, unspecified: Secondary | ICD-10-CM

## 2023-11-20 DIAGNOSIS — I10 Essential (primary) hypertension: Secondary | ICD-10-CM

## 2023-11-20 DIAGNOSIS — Z87891 Personal history of nicotine dependence: Secondary | ICD-10-CM | POA: Diagnosis not present

## 2023-11-20 DIAGNOSIS — E66811 Obesity, class 1: Secondary | ICD-10-CM | POA: Diagnosis not present

## 2023-11-20 DIAGNOSIS — F988 Other specified behavioral and emotional disorders with onset usually occurring in childhood and adolescence: Secondary | ICD-10-CM

## 2023-11-20 DIAGNOSIS — F900 Attention-deficit hyperactivity disorder, predominantly inattentive type: Secondary | ICD-10-CM

## 2023-11-20 DIAGNOSIS — Z1211 Encounter for screening for malignant neoplasm of colon: Secondary | ICD-10-CM

## 2023-11-20 MED ORDER — AMPHETAMINE-DEXTROAMPHETAMINE 20 MG PO TABS: ORAL_TABLET | ORAL | 0 refills | Status: DC

## 2023-11-20 NOTE — Assessment & Plan Note (Signed)
 Patient currently maintained on half to 1 tablet Adderall twice daily.  Patient takes 1-1/2 tablets a day per his report.  PDMP reviewed.  Refill of medication sent in the pharmacy.

## 2023-11-20 NOTE — Patient Instructions (Signed)
 Nice to see you today I have sent in the adderall Follow up with me in 3 month for your physical and labs, sooner if you need me

## 2023-11-20 NOTE — Assessment & Plan Note (Signed)
History of the same.

## 2023-11-20 NOTE — Assessment & Plan Note (Signed)
 History of same.  Patient currently maintained on Lexapro 20 mg daily.  He denies HI/SI/AVH.  Continue taking medication as prescribed

## 2023-11-20 NOTE — Progress Notes (Signed)
 New Patient Office Visit  Subjective    Patient ID: Derek Diaz, male    DOB: Oct 30, 1965  Age: 58 y.o. MRN: [id number]  CC:  Chief Complaint  Patient presents with   Establish Care    Pt states that Adderall prescription needs to be refilled within the next week.     HPI Derek Diaz presents to establish care  Per patient of Vangie Genet   ADHD: 30mg  daily and does. States that sometimes he does have trouble with sleeping but he thinks that is related to his neck    HTN: does have a cuff and does not check his blood pressure. Does well with atenolol 50 mg daily.  DDD of cervical spine: state that he has collapsed verebra. He is seen by ortho Bazbo and is maintained on lyrica. He has done pain management and PT With out great relief.  He takes the Lyrica Lyrica at night. He has neck pain with tingling in the left arm and goes down the left leg.  Patient states surgery will have to happen eventually  Anxiety: states that he is lexapro. He does well with it.  Patient denies HI/SI/AVH  Colonoscopy: has not had colonoscopy. Needs to repeat cologuard.  States last 1 was approximate 5 years ago PSA: 12/20/2022.  That was normal  Skin lesion: Has been evaluated by previous PCP was given triamcinolone cream that does help for short period time and then it comes back.  Patient does not want to see a dermatologist at this juncture.  [place]  Tdap: 2019 Flu: 06/26/2023 Covid: original series  Shingles: Discussed in office    Outpatient Encounter Medications as of 11/20/2023  Medication Sig   Ascorbic Acid (VITAMIN C) 500 MG CAPS Take 1 capsule by mouth daily.   atenolol (TENORMIN) 50 MG tablet TAKE 1 TABLET BY MOUTH EVERY DAY FOR BLOOD PRESSURE   Cholecalciferol (VITAMIN D) 125 MCG (5000 UT) CAPS Take 2 capsules by mouth daily.   diclofenac (VOLTAREN) 75 MG EC tablet Take 75 mg by mouth 2 (two) times daily as  needed.   escitalopram (LEXAPRO) 20 MG tablet TAKE 1 TABLET BY MOUTH DAILY FOR MOOD   Multiple Vitamin (MULTIVITAMIN) tablet Take 1 tablet by mouth daily.   OVER THE COUNTER MEDICATION Takes OTC iron 1 tablet daily.   pregabalin (LYRICA) 75 MG capsule Take by mouth.   triamcinolone (KENALOG) 0.025 % ointment APPLY TO AFFECTED AREA TWICE A DAY   zinc gluconate 50 MG tablet Take 50 mg by mouth daily.   [DISCONTINUED] amphetamine-dextroamphetamine (ADDERALL) 20 MG tablet Take  1/2 to 1 tablet  2 x / day  as needed for ADD, Focus & Concentration   amphetamine-dextroamphetamine (ADDERALL) 20 MG tablet Take  1/2 to 1 tablet  2 x / day  as needed for ADD, Focus & Concentration   amphetamine-dextroamphetamine (ADDERALL) 20 MG tablet Take  1/2 to 1 tablet  2 x / day  as needed for ADD, Focus & Concentration   amphetamine-dextroamphetamine (ADDERALL) 20 MG tablet Take  1/2 to 1 tablet  2 x / day  as needed for ADD, Focus & Concentration   No facility-administered encounter medications on file as of 11/20/2023.    Past Medical History:  Diagnosis Date   Anxiety    GERD (gastroesophageal reflux disease)    Hypertension     Past Surgical History:  Procedure Laterality Date   adnoids  TUMOR EXCISION     lower spine 58yrs old    Family History  Problem Relation Age of Onset   Stroke Mother    Heart disease Mother    Stroke Father    Hypertension Father    Heart disease Father    Hypertension Brother    Cancer Maternal Grandmother        breast   Cancer Maternal Grandfather        unsure    Social History   Socioeconomic History   Marital status: Married    Spouse name: Ave Leisure   Number of children: 2   Years of education: Not on file   Highest education level: Not on file  Occupational History   Not on file  Tobacco Use   Smoking status: Former    Current packs/day: 0.00    Types: Cigarettes    Quit date: 10/17/2016    Years since quitting: 7.0   Smokeless tobacco: Never    Tobacco comments:    Smoke  for 40 years. 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  Respiratory:  Negative for shortness of breath.   Cardiovascular:  Negative for chest pain and leg swelling.  Gastrointestinal:  Negative for abdominal pain, blood in stool, constipation, diarrhea, nausea and vomiting.  Genitourinary:  Negative for dysuria and hematuria.  Neurological:  Negative for tingling and headaches.  Psychiatric/Behavioral:  Negative for hallucinations and suicidal ideas.         Objective    BP 110/70   Pulse 78   Temp 98 F (36.7 C) (Oral)   Ht 5' 7.45" (1.713 m)   Wt 210 lb 3.2 oz (95.3 kg)   SpO2 90%   BMI 32.48 kg/m   Physical Exam Vitals and nursing note reviewed.  Constitutional:      Appearance: Normal appearance.  HENT:     Right Ear: Tympanic membrane, ear canal and external ear normal.     Left Ear: Tympanic membrane, ear canal and external ear normal.     Mouth/Throat:     Mouth: Mucous membranes are moist.     Pharynx: Oropharynx is clear.  Eyes:     Extraocular Movements: Extraocular movements intact.     Pupils: Pupils are equal, round, and reactive to light.  Cardiovascular:     Rate and Rhythm: Normal rate and regular rhythm.     Pulses: Normal pulses.     Heart sounds: Normal heart sounds.  Pulmonary:     Effort: Pulmonary effort is normal.     Breath sounds: Normal  breath sounds.  Musculoskeletal:     Right lower leg: No edema.     Left lower leg: No edema.  Lymphadenopathy:     Cervical: No cervical adenopathy.  Skin:    General: Skin  is warm.  Neurological:     General: No focal deficit present.     Mental Status: He is alert.     Deep Tendon Reflexes:     Reflex Scores:      Bicep reflexes are 2+ on the right side and 2+ on the left side.      Patellar reflexes are 2+ on the right side and 2+ on the left side.    Comments: Bilateral upper and lower extremity strength 5/5  Psychiatric:        Mood and Affect: Mood normal.        Behavior: Behavior normal.        Thought Content: Thought content normal.        Judgment: Judgment normal.         Assessment & Plan:   Problem List Items Addressed This Visit       Cardiovascular and Mediastinum   Essential hypertension - Primary   Patient currently maintained on atenolol 50 mg daily.  Blood pressure well-controlled.  Most previous labs.  Continue medication as prescribed        Other   Former smoker   Patient does have history of smoking.  Has not smoked in several years      Anxiety tension state   History of same.  Patient currently maintained on Lexapro 20 mg daily.  He denies HI/SI/AVH.  Continue taking medication as prescribed      Obesity (BMI 30.0-34.9)   History of the same      Attention deficit disorder (ADD) - inattention   Patient currently maintained on half to 1 tablet Adderall twice daily.  Patient takes 1-1/2 tablets a day per his report.  PDMP reviewed.  Refill of medication sent in the pharmacy.      Other Visit Diagnoses       Attention deficit hyperactivity disorder (ADHD), predominantly inattentive type       Relevant Medications   amphetamine-dextroamphetamine (ADDERALL) 20 MG tablet   amphetamine-dextroamphetamine (ADDERALL) 20 MG tablet   amphetamine-dextroamphetamine (ADDERALL) 20 MG tablet     Screening for colon cancer       Relevant Orders    Cologuard       Return in about 3 months (around 02/19/2024) for CPE and Labs.   Margarie Shay, NP

## 2023-11-20 NOTE — Assessment & Plan Note (Signed)
 Patient does have history of smoking.  Has not smoked in several years

## 2023-11-20 NOTE — Assessment & Plan Note (Signed)
 Patient currently maintained on atenolol 50 mg daily.  Blood pressure well-controlled.  Most previous labs.  Continue medication as prescribed

## 2023-12-29 ENCOUNTER — Encounter: Payer: Self-pay | Admitting: Internal Medicine

## 2024-01-22 LAB — COLOGUARD: COLOGUARD: NEGATIVE

## 2024-01-23 ENCOUNTER — Ambulatory Visit: Payer: Self-pay | Admitting: Nurse Practitioner

## 2024-02-19 ENCOUNTER — Telehealth: Payer: Self-pay | Admitting: Nurse Practitioner

## 2024-02-19 ENCOUNTER — Encounter: Admitting: Nurse Practitioner

## 2024-02-19 DIAGNOSIS — F900 Attention-deficit hyperactivity disorder, predominantly inattentive type: Secondary | ICD-10-CM

## 2024-02-19 MED ORDER — AMPHETAMINE-DEXTROAMPHETAMINE 20 MG PO TABS: ORAL_TABLET | ORAL | 0 refills | Status: DC

## 2024-02-19 NOTE — Telephone Encounter (Signed)
 Prescription Request Derek Diaz  02/19/2024  LOV: 11/20/2023  What is the name of the medication or equipment? amphetamine -dextroamphetamine  (ADDERALL) 20 MG tablet    Have you contacted your pharmacy to request a refill? Yes   Which pharmacy would you like this sent to?  [id number] [place], [place] - [address] [place] [address] Phone: [phone] Fax: [phone]  Patient notified that their request is being sent to the clinical staff for review and that they should receive a response within 2 business days.   Please advise at Mobile [phone] (mobile)

## 2024-02-19 NOTE — Progress Notes (Deleted)
   Established Patient Office Visit  Subjective   Patient ID: Derek Diaz, male    DOB: May 01, 1966  Age: 58 y.o. MRN: [id number]  No chief complaint on file.   HPI  ADHD: currently maintained on adderral 30mg  daily   MDD/GAD: on lexapro  20mg  daily and does well  HTN: on atenolol  50 mg daily and does not check blood pressure at home   for complete physical and follow up of chronic conditions.  Immunizations: -Tetanus: Completed in 2019 -Influenza: out of season  -Shingles:  -Pneumonia: too young   Diet: Fair diet.  Exercise: No regular exercise.  Eye exam: Completes annually  Dental exam: Completes semi-annually    Colonoscopy: Completed in cologuard pending  Lung Cancer Screening: NA   PSA: Due  Sleep:     {History (Optional):23778}  ROS    Objective:     There were no vitals taken for this visit. {Vitals History (Optional):23777}  Physical Exam   No results found for any visits on 02/19/24.  {Labs (Optional):23779}  The 10-year ASCVD risk score (Arnett DK, et al., 2019) is: 6.6%    Assessment & Plan:   Problem List Items Addressed This Visit   None   No follow-ups on file.    Adina Crandall, NP

## 2024-02-19 NOTE — Addendum Note (Signed)
 Addended by: WENDEE LYNWOOD HERO on: 02/19/2024 02:31 PM   Modules accepted: Orders

## 2024-03-11 ENCOUNTER — Ambulatory Visit (INDEPENDENT_AMBULATORY_CARE_PROVIDER_SITE_OTHER): Admitting: Nurse Practitioner

## 2024-03-11 ENCOUNTER — Encounter: Payer: Self-pay | Admitting: Nurse Practitioner

## 2024-03-11 VITALS — BP 122/76 | HR 64 | Temp 98.1°F | Ht 67.25 in | Wt 207.2 lb

## 2024-03-11 DIAGNOSIS — E559 Vitamin D deficiency, unspecified: Secondary | ICD-10-CM

## 2024-03-11 DIAGNOSIS — Z87891 Personal history of nicotine dependence: Secondary | ICD-10-CM

## 2024-03-11 DIAGNOSIS — Z0000 Encounter for general adult medical examination without abnormal findings: Secondary | ICD-10-CM | POA: Diagnosis not present

## 2024-03-11 DIAGNOSIS — Z131 Encounter for screening for diabetes mellitus: Secondary | ICD-10-CM

## 2024-03-11 DIAGNOSIS — Z1159 Encounter for screening for other viral diseases: Secondary | ICD-10-CM

## 2024-03-11 DIAGNOSIS — I10 Essential (primary) hypertension: Secondary | ICD-10-CM | POA: Diagnosis not present

## 2024-03-11 DIAGNOSIS — F988 Other specified behavioral and emotional disorders with onset usually occurring in childhood and adolescence: Secondary | ICD-10-CM

## 2024-03-11 DIAGNOSIS — Z8249 Family history of ischemic heart disease and other diseases of the circulatory system: Secondary | ICD-10-CM

## 2024-03-11 DIAGNOSIS — E66811 Obesity, class 1: Secondary | ICD-10-CM

## 2024-03-11 DIAGNOSIS — F419 Anxiety disorder, unspecified: Secondary | ICD-10-CM

## 2024-03-11 DIAGNOSIS — Z114 Encounter for screening for human immunodeficiency virus [HIV]: Secondary | ICD-10-CM | POA: Diagnosis not present

## 2024-03-11 DIAGNOSIS — Z23 Encounter for immunization: Secondary | ICD-10-CM

## 2024-03-11 DIAGNOSIS — Z125 Encounter for screening for malignant neoplasm of prostate: Secondary | ICD-10-CM | POA: Diagnosis not present

## 2024-03-11 DIAGNOSIS — F900 Attention-deficit hyperactivity disorder, predominantly inattentive type: Secondary | ICD-10-CM

## 2024-03-11 DIAGNOSIS — Z122 Encounter for screening for malignant neoplasm of respiratory organs: Secondary | ICD-10-CM

## 2024-03-11 LAB — URINALYSIS, MICROSCOPIC ONLY

## 2024-03-11 LAB — COMPREHENSIVE METABOLIC PANEL WITH GFR
ALT: 25 U/L (ref 0–53)
AST: 20 U/L (ref 0–37)
Albumin: 4.6 g/dL (ref 3.5–5.2)
Alkaline Phosphatase: 78 U/L (ref 39–117)
BUN: 19 mg/dL (ref 6–23)
CO2: 30 meq/L (ref 19–32)
Calcium: 9.3 mg/dL (ref 8.4–10.5)
Chloride: 107 meq/L (ref 96–112)
Creatinine, Ser: 0.98 mg/dL (ref 0.40–1.50)
GFR: 84.58 mL/min (ref >60.00)
Glucose, Bld: 86 mg/dL (ref 70–99)
Potassium: 4.3 meq/L (ref 3.5–5.1)
Sodium: 143 meq/L (ref 135–145)
Total Bilirubin: 0.6 mg/dL (ref 0.2–1.2)
Total Protein: 6.6 g/dL (ref 6.0–8.3)

## 2024-03-11 LAB — LIPID PANEL
Cholesterol: 154 mg/dL (ref 0–200)
HDL: 40.3 mg/dL (ref >39.00)
LDL Cholesterol: 96 mg/dL (ref 0–99)
NonHDL: 114.18
Total CHOL/HDL Ratio: 4
Triglycerides: 89 mg/dL (ref 0.0–149.0)
VLDL: 17.8 mg/dL (ref 0.0–40.0)

## 2024-03-11 LAB — CBC
HCT: 40.4 % (ref 39.0–52.0)
Hemoglobin: 13.3 g/dL (ref 13.0–17.0)
MCHC: 33 g/dL (ref 30.0–36.0)
MCV: 91 fl (ref 78.0–100.0)
Platelets: 180 K/uL (ref 150.0–400.0)
RBC: 4.44 Mil/uL (ref 4.22–5.81)
RDW: 14 % (ref 11.5–15.5)
WBC: 3.7 K/uL — ABNORMAL LOW (ref 4.0–10.5)

## 2024-03-11 LAB — HEMOGLOBIN A1C: Hgb A1c MFr Bld: 4.5 % — ABNORMAL LOW (ref 4.6–6.5)

## 2024-03-11 LAB — VITAMIN D 25 HYDROXY (VIT D DEFICIENCY, FRACTURES): VITD: 73.88 ng/mL (ref 30.00–100.00)

## 2024-03-11 LAB — TSH: TSH: 0.95 u[IU]/mL (ref 0.35–5.50)

## 2024-03-11 LAB — PSA: PSA: 0.67 ng/mL (ref 0.10–4.00)

## 2024-03-11 MED ORDER — AMPHETAMINE-DEXTROAMPHETAMINE 20 MG PO TABS: ORAL_TABLET | ORAL | 0 refills | Status: DC

## 2024-03-11 MED ORDER — ESCITALOPRAM OXALATE 20 MG PO TABS: ORAL_TABLET | ORAL | 3 refills | Status: AC

## 2024-03-11 MED ORDER — ATENOLOL 50 MG PO TABS: ORAL_TABLET | ORAL | 3 refills | Status: AC

## 2024-03-11 NOTE — Assessment & Plan Note (Signed)
 History of same pending vitamin D level today

## 2024-03-11 NOTE — Addendum Note (Signed)
 Addended by: SEBASTIAN SHU on: 03/11/2024 10:39 AM   Modules accepted: Orders

## 2024-03-11 NOTE — Assessment & Plan Note (Signed)
 Currently maintained on Adderall 20 mg twice daily.  PDMP reviewed.  No red flags.  Refills provided today

## 2024-03-11 NOTE — Assessment & Plan Note (Signed)
 Patient currently maintained on atenolol  10 mg daily.  Will pressure control.  Refill provided today.  Continue taking medication as prescribed

## 2024-03-11 NOTE — Assessment & Plan Note (Signed)
 Patient is continue to work on weight loss.  Continue working on healthy lifestyle altercations.  Pending TSH, lipid panel A1c.

## 2024-03-11 NOTE — Progress Notes (Signed)
 Established Patient Office Visit  Subjective   Patient ID: Derek Diaz, male    DOB: 25-Aug-1965  Age: 58 y.o. MRN: [id number]  Chief Complaint  Patient presents with   Annual Exam    HPI  ADHD: Patient currently maintained on Adderall 10 to 20 mg BID.   Mood: Patient currently maintained on Lexapro  20 mg daily.  HTN: Patient currently maintained on atenolol  50 mg daily. States that he will check it sometimes at home   DDD of cervical spine: Patient is currently followed by Dr. Arthor and maintained on Lyrica daily  for complete physical and follow up of chronic conditions.  Immunizations: -Tetanus: Completed in 2019 -Influenza: Out of season -Shingles: update today  -Pneumonia: update today   Diet: Fair diet. He is eating 2 meals a day and sometimes 3 a day. Does not snakc much. Unsweet tea and water.  Exercise: No regular exercise. States that he walks 4.5-5 miles a day.   Eye exam: readers   Dental exam: dentures    Colonoscopy: Completed Cologuard 01/15/2024 that was negative Lung Cancer Screening: amb ref to LDCT  PSA: Due  Sleep: going to bed around 05-1029 and get up 5-530. States that he does feel rested. He does have a 59 year old in house        Review of Systems  Constitutional:  Negative for chills and fever.  Respiratory:  Negative for shortness of breath.   Cardiovascular:  Negative for chest pain and leg swelling.  Gastrointestinal:  Negative for abdominal pain, blood in stool, constipation, diarrhea, nausea and vomiting.       BM daily   Genitourinary:  Negative for dysuria and hematuria.  Neurological:  Positive for tingling and headaches. Negative for dizziness.  Psychiatric/Behavioral:  Negative for hallucinations and suicidal ideas.       Objective:     BP 122/76   Pulse 64   Temp 98.1 F (36.7 C) (Oral)   Ht 5' 7.25 (1.708 m)   Wt 207 lb 3.2 oz (94 kg)   SpO2 97%   BMI 32.21 kg/m  BP Readings from Last 3 Encounters:   03/11/24 122/76  11/20/23 110/70  06/26/23 122/74   Wt Readings from Last 3 Encounters:  03/11/24 207 lb 3.2 oz (94 kg)  11/20/23 210 lb 3.2 oz (95.3 kg)  06/26/23 208 lb (94.3 kg)   SpO2 Readings from Last 3 Encounters:  03/11/24 97%  11/20/23 90%  06/26/23 98%      Physical Exam Vitals and nursing note reviewed.  Constitutional:      Appearance: Normal appearance.  HENT:     Right Ear: Tympanic membrane, ear canal and external ear normal.     Left Ear: Tympanic membrane, ear canal and external ear normal.     Mouth/Throat:     Mouth: Mucous membranes are moist.     Pharynx: Oropharynx is clear.  Eyes:     Extraocular Movements: Extraocular movements intact.     Pupils: Pupils are equal, round, and reactive to light.  Cardiovascular:     Rate and Rhythm: Normal rate and regular rhythm.     Pulses: Normal pulses.     Heart sounds: Normal heart sounds.  Pulmonary:     Effort: Pulmonary effort is normal.     Breath sounds: Normal breath sounds.  Abdominal:     General: Bowel sounds are normal. There is no distension.     Palpations: There is no mass.  Tenderness: There is no abdominal tenderness.     Hernia: No hernia is present.  Genitourinary:    Comments: deferred Musculoskeletal:     Right lower leg: No edema.     Left lower leg: No edema.  Lymphadenopathy:     Cervical: No cervical adenopathy.  Skin:    General: Skin is warm.  Neurological:     General: No focal deficit present.     Mental Status: He is alert.     Deep Tendon Reflexes:     Reflex Scores:      Bicep reflexes are 2+ on the right side and 2+ on the left side.      Patellar reflexes are 2+ on the right side and 2+ on the left side.    Comments: Bilateral upper and lower extremity strength 5/5  Psychiatric:        Mood and Affect: Mood normal.        Behavior: Behavior normal.        Thought Content: Thought content normal.        Judgment: Judgment normal.      No results found  for any visits on 03/11/24.    The 10-year ASCVD risk score (Arnett DK, et al., 2019) is: 7.8%    Assessment & Plan:   Problem List Items Addressed This Visit       Cardiovascular and Mediastinum   Essential hypertension   Patient currently maintained on atenolol  10 mg daily.  Will pressure control.  Refill provided today.  Continue taking medication as prescribed      Relevant Medications   atenolol  (TENORMIN ) 50 MG tablet   Other Relevant Orders   CBC   Comprehensive metabolic panel with GFR   Hemoglobin A1c   TSH   Lipid panel     Other   Vitamin D  deficiency   History of same pending vitamin D  level today      Relevant Orders   VITAMIN D  25 Hydroxy (Vit-D Deficiency, Fractures)   Former smoker   History of the same pending urine microscopy to screen for microscopic hematuria.  Patient was also referred for LDCT for lung cancer screening      Relevant Orders   Urine Microscopic   FHx: heart disease   Relevant Orders   Hemoglobin A1c   Lipid panel   Anxiety tension state   Currently maintained on Lexapro  20 mg daily.  Patient denies HI/SI/AVH.  Continue medication as prescribed      Relevant Medications   escitalopram  (LEXAPRO ) 20 MG tablet   Obesity (BMI 30.0-34.9)   Patient is continue to work on weight loss.  Continue working on healthy lifestyle altercations.  Pending TSH, lipid panel A1c.      Attention deficit hyperactivity disorder (ADHD), predominantly inattentive type   Currently maintained on Adderall 20 mg twice daily.  PDMP reviewed.  No red flags.  Refills provided today      Relevant Medications   amphetamine -dextroamphetamine  (ADDERALL) 20 MG tablet   amphetamine -dextroamphetamine  (ADDERALL) 20 MG tablet   amphetamine -dextroamphetamine  (ADDERALL) 20 MG tablet   Preventative health care - Primary   Discussed age-appropriate immunizations and screening exams.  Did review patient's personal, surgical, social, family histories.  Patient is  up-to-date on all age-appropriate vaccinations he would like.  Update for shingles vaccine today and Prevnar 20 today.  Patient is up-to-date on CRC screening.  Pending PSA today for prostate cancer screening patient was given information at discharge about preventative healthcare maintenance with  anticipatory guidance.      Other Visit Diagnoses       Encounter for screening for HIV       Relevant Orders   HIV antibody (with reflex)     Encounter for hepatitis C screening test for low risk patient       Relevant Orders   Hepatitis C antibody     Screening for prostate cancer       Relevant Orders   PSA     Screening for lung cancer       Relevant Orders   Ambulatory Referral Lung Cancer Screening [facility]     Screening for diabetes mellitus       Relevant Orders   Hemoglobin A1c       Return in about 6 months (around 09/11/2024) for ADD/second shingles vaccine .    Adina Crandall, NP

## 2024-03-11 NOTE — Assessment & Plan Note (Signed)
 Discussed age-appropriate immunizations and screening exams.  Did review patient's personal, surgical, social, family histories.  Patient is up-to-date on all age-appropriate vaccinations he would like.  Update for shingles vaccine today and Prevnar 20 today.  Patient is up-to-date on CRC screening.  Pending PSA today for prostate cancer screening patient was given information at discharge about preventative healthcare maintenance with anticipatory guidance.

## 2024-03-11 NOTE — Patient Instructions (Signed)
Nice to see you today I will be in touch with the labs once I have them Follow up with me in 6 months, sooner if you need me 

## 2024-03-11 NOTE — Assessment & Plan Note (Signed)
 History of the same pending urine microscopy to screen for microscopic hematuria.  Patient was also referred for LDCT for lung cancer screening

## 2024-03-11 NOTE — Assessment & Plan Note (Signed)
 Currently maintained on Lexapro  20 mg daily.  Patient denies HI/SI/AVH.  Continue medication as prescribed

## 2024-03-12 LAB — HIV ANTIBODY (ROUTINE TESTING W REFLEX): HIV 1&2 Ab, 4th Generation: NONREACTIVE

## 2024-03-12 LAB — HEPATITIS C ANTIBODY: Hepatitis C Ab: NONREACTIVE

## 2024-03-14 ENCOUNTER — Ambulatory Visit: Payer: Self-pay | Admitting: Nurse Practitioner

## 2024-03-25 ENCOUNTER — Telehealth: Payer: Self-pay

## 2024-03-25 DIAGNOSIS — Z122 Encounter for screening for malignant neoplasm of respiratory organs: Secondary | ICD-10-CM

## 2024-03-25 DIAGNOSIS — Z87891 Personal history of nicotine dependence: Secondary | ICD-10-CM

## 2024-03-25 NOTE — Telephone Encounter (Signed)
 Lung Cancer Screening Narrative/Criteria Questionnaire (Cigarette Smokers Only- No Cigars/Pipes/vapes)   Burnard Paras   SDMV:03/29/2024 at 2:30 pm with Josette        1965/11/22               LDCT: 04/09/2024 at 7:30 am at [facility]    58 y.o.   Phone: [phone]  Lung Screening Narrative (confirm age 61-77 yrs Medicare / 50-80 yrs Private pay insurance)   Insurance information: Community education officer   Referring Provider: Wendee, NP   This screening involves an initial phone call with a team member from our program. It is called a shared decision making visit. The initial meeting is required by  insurance and Medicare to make sure you understand the program. This appointment takes about 15-20 minutes to complete. You will complete the screening scan at your scheduled date/time.  This scan takes about 5-10 minutes to complete. You can eat and drink normally before and after the scan.  Criteria questions for Lung Cancer Screening:   Are you a current or former smoker? Former Age began smoking: 9   If you are a former smoker, what year did you quit smoking? Quit smoking in 2017. (within 15 yrs)   To calculate your smoking history, I need an accurate estimate of how many packs of cigarettes you smoked per day and for how many years. (Not just the number of PPD you are now smoking)   Years smoking 41 x Packs per day 2 = Pack years 82   (at least 20 pack yrs)   (Make sure they understand that we need to know how much they have smoked in the past, not just the number of PPD they are smoking now)  Do you have a personal history of cancer?  No    Do you have a family history of cancer? Yes  (cancer type and and relative) Grand mother had breast cancer and Grandfather had unknown type. Both on mothers side.   Are you coughing up blood?  No  Have you had unexplained weight loss of 15 lbs or more in the last 6 months? No  It looks like you meet all criteria.  When would be a good time for us  to schedule you for  this screening?   Additional information: N/A

## 2024-03-29 ENCOUNTER — Ambulatory Visit

## 2024-03-29 ENCOUNTER — Encounter: Payer: Self-pay | Admitting: *Deleted

## 2024-03-29 DIAGNOSIS — Z87891 Personal history of nicotine dependence: Secondary | ICD-10-CM

## 2024-03-29 NOTE — Progress Notes (Signed)
 Virtual Visit via Telephone Note  I connected with Derek Diaz on 03/29/24 at  2:30 PM EDT by telephone and verified that I am speaking with the correct person using two identifiers.  Location: Patient: in home Provider: [address] [place], [place], [address]   Shared Decision Making Visit Lung Cancer Screening Program [phone])   Eligibility: Age 58 y.o. Pack Years Smoking History Calculation 82 (# packs/per year x # years smoked) Recent History of coughing up blood  no Unexplained weight loss? no ( >Than 15 pounds within the last 6 months ) Prior History Lung / other cancer no (Diagnosis within the last 5 years already requiring surveillance chest CT Scans). Smoking Status Former Smoker Former Smokers: Years since quit: 8 years  Quit Date: 2017  Visit Components: Discussion included one or more decision making aids. yes Discussion included risk/benefits of screening. yes Discussion included potential follow up diagnostic testing for abnormal scans. yes Discussion included meaning and risk of over diagnosis. yes Discussion included meaning and risk of False Positives. yes Discussion included meaning of total radiation exposure. yes  Counseling Included: Importance of adherence to annual lung cancer LDCT screening. yes Impact of comorbidities on ability to participate in the program. yes Ability and willingness to under diagnostic treatment. yes  Smoking Cessation Counseling: Current Smokers:  Discussed importance of smoking cessation. yes Information about tobacco cessation classes and interventions provided to patient. yes Patient provided with ticket for LDCT Scan. yes Symptomatic Patient. no  Counseling NA Diagnosis Code: Tobacco Use Z72.0 Asymptomatic Patient yes  Counseling (Intermediate counseling: > three minutes counseling) H9563  Counseled patient 3-4 minutes regarding tobacco use.   Former Smokers:  Discussed the importance of maintaining  cigarette abstinence. yes Diagnosis Code: Personal History of Nicotine Dependence. S12.108 Information about tobacco cessation classes and interventions provided to patient. Yes Patient provided with ticket for LDCT Scan. yes Written Order for Lung Cancer Screening with LDCT placed in Epic. Yes (CT Chest Lung Cancer Screening Low Dose W/O CM) [id number] Z12.2-Screening of respiratory organs Z87.891-Personal history of nicotine dependence   Josette Ranger, RN 03/29/24

## 2024-03-29 NOTE — Patient Instructions (Signed)

## 2024-04-03 ENCOUNTER — Encounter: Payer: Self-pay | Admitting: Acute Care

## 2024-04-04 ENCOUNTER — Encounter: Payer: Self-pay | Admitting: Acute Care

## 2024-04-09 ENCOUNTER — Ambulatory Visit
Admission: RE | Admit: 2024-04-09 | Discharge: 2024-04-09 | Disposition: A | Discharge status: Home / Self Care | Source: Ambulatory Visit | Attending: Acute Care | Admitting: Acute Care

## 2024-04-09 DIAGNOSIS — Z87891 Personal history of nicotine dependence: Secondary | ICD-10-CM

## 2024-04-09 DIAGNOSIS — Z122 Encounter for screening for malignant neoplasm of respiratory organs: Secondary | ICD-10-CM

## 2024-04-17 ENCOUNTER — Other Ambulatory Visit: Payer: Self-pay

## 2024-04-17 DIAGNOSIS — Z87891 Personal history of nicotine dependence: Secondary | ICD-10-CM

## 2024-04-17 DIAGNOSIS — Z122 Encounter for screening for malignant neoplasm of respiratory organs: Secondary | ICD-10-CM

## 2024-09-11 ENCOUNTER — Ambulatory Visit: Admitting: Nurse Practitioner

## 2024-09-11 VITALS — BP 122/80 | HR 71 | Temp 98.2°F | Ht 67.25 in | Wt 205.8 lb

## 2024-09-11 DIAGNOSIS — Z23 Encounter for immunization: Secondary | ICD-10-CM

## 2024-09-11 DIAGNOSIS — F900 Attention-deficit hyperactivity disorder, predominantly inattentive type: Secondary | ICD-10-CM | POA: Diagnosis not present

## 2024-09-11 MED ORDER — AMPHETAMINE-DEXTROAMPHETAMINE 20 MG PO TABS: ORAL_TABLET | ORAL | 0 refills | Status: AC

## 2024-09-11 NOTE — Patient Instructions (Signed)
 Nice to see you today  We did give your second and final shingles vaccine along with your flu vaccine I have refilled your medications Follow up with me in 6 months for you physical and labs, sooner if you need me

## 2024-09-11 NOTE — Progress Notes (Signed)
 "  Established Patient Office Visit  Subjective   Patient ID: Derek Diaz, male    DOB: 1965-10-25  Age: 59 y.o. MRN: [id number]  Chief Complaint  Patient presents with   Follow-up    HPI  Discussed the use of AI scribe software for clinical note transcription with the patient, who gave verbal consent to proceed.  History of Present Illness He is a 59 year old male who presents for a medication check-in and vaccinations.  He is currently taking Adderall, dosing between half to a whole 20 mg tablet as needed BID. He occasionally experiences difficulty sleeping due to anxiety and life stress, but no significant sleep issues are related to the medication. His appetite remains stable, although he has noticed a gradual weight loss from 210 lbs in April to 205 lbs currently, which he attributes to staying busy despite being at home more often recently.  He previously received the first shingles vaccine without any significant side effects, experiencing only mild soreness at the injection site similar to a flu shot.  No chest pain, shortness of breath, or palpitations. He has no other concerns at this time.    Review of Systems  Constitutional:  Negative for chills and fever.  Respiratory:  Negative for shortness of breath.   Cardiovascular:  Negative for chest pain and palpitations.  Neurological:  Negative for headaches.  Psychiatric/Behavioral:  The patient does not have insomnia (occassional sleep distrubance).       Objective:     BP 122/80   Pulse 71   Temp 98.2 F (36.8 C) (Oral)   Ht 5' 7.25 (1.708 m)   Wt 205 lb 12.8 oz (93.4 kg)   SpO2 99%   BMI 31.99 kg/m  BP Readings from Last 3 Encounters:  09/11/24 122/80  03/11/24 122/76  11/20/23 110/70   Wt Readings from Last 3 Encounters:  09/11/24 205 lb 12.8 oz (93.4 kg)  03/11/24 207 lb 3.2 oz (94 kg)  11/20/23 210 lb 3.2 oz (95.3 kg)   SpO2 Readings from Last 3 Encounters:  09/11/24 99%  03/11/24 97%   11/20/23 90%      Physical Exam Vitals and nursing note reviewed.  Constitutional:      Appearance: Normal appearance.  Cardiovascular:     Rate and Rhythm: Normal rate and regular rhythm.     Heart sounds: Normal heart sounds.  Pulmonary:     Effort: Pulmonary effort is normal.     Breath sounds: Normal breath sounds.  Neurological:     Mental Status: He is alert.      No results found for any visits on 09/11/24.    The 10-year ASCVD risk score (Arnett DK, et al., 2019) is: 7.8%    Assessment & Plan:   Problem List Items Addressed This Visit       Other   Attention deficit hyperactivity disorder (ADHD), predominantly inattentive type - Primary   Relevant Medications   amphetamine -dextroamphetamine  (ADDERALL) 20 MG tablet   amphetamine -dextroamphetamine  (ADDERALL) 20 MG tablet   amphetamine -dextroamphetamine  (ADDERALL) 20 MG tablet   Other Visit Diagnoses       Need for shingles vaccine       Relevant Orders   Varicella-zoster vaccine IM (Completed)     Need for influenza vaccination       Relevant Orders   Flu vaccine trivalent PF, 6mos and older(Flulaval,Afluria,Fluarix,Fluzone) (Completed)      Assessment and Plan Assessment & Plan Attention deficit hyperactivity disorder, predominantly inattentive type ADHD managed  with Adderall 10 to 20 mg BID. He self-adjusts dose. Occasional sleep disturbances due to anxiety and stress. Weight decreased from 210 lbs to 205 lbs. - Sent three Adderall prescriptions to [facility]. - Advised to contact provider after third prescription for further management.  General Health Maintenance No adverse reactions to first shingles vaccine. - Administered flu vaccine. - Administered second Shingrix  vaccine today   Return in about 6 months (around 03/11/2025) for CPE and Labs.    Adina Crandall, NP  "

## 2025-03-12 ENCOUNTER — Encounter: Admitting: Nurse Practitioner
